# Patient Record
Sex: Male | Born: 1955 | Race: White | Hispanic: No | Marital: Married | Smoking: Former smoker
Health system: Southern US, Community
[De-identification: ages and names within clinical notes are randomized; demographics above are authoritative.]

## PROBLEM LIST (undated history)

## (undated) DIAGNOSIS — J449 Chronic obstructive pulmonary disease, unspecified: Secondary | ICD-10-CM

## (undated) DIAGNOSIS — G2581 Restless legs syndrome: Secondary | ICD-10-CM

## (undated) DIAGNOSIS — F32A Depression, unspecified: Secondary | ICD-10-CM

---

## 2015-05-21 DIAGNOSIS — I2699 Other pulmonary embolism without acute cor pulmonale: Secondary | ICD-10-CM

## 2015-05-21 HISTORY — DX: Other pulmonary embolism without acute cor pulmonale: I26.99

## 2015-11-06 DIAGNOSIS — R079 Chest pain, unspecified: Secondary | ICD-10-CM

## 2015-11-06 DIAGNOSIS — I2699 Other pulmonary embolism without acute cor pulmonale: Secondary | ICD-10-CM

## 2015-11-06 DIAGNOSIS — I82409 Acute embolism and thrombosis of unspecified deep veins of unspecified lower extremity: Secondary | ICD-10-CM

## 2015-11-29 DIAGNOSIS — I2699 Other pulmonary embolism without acute cor pulmonale: Secondary | ICD-10-CM

## 2015-11-29 DIAGNOSIS — I82409 Acute embolism and thrombosis of unspecified deep veins of unspecified lower extremity: Secondary | ICD-10-CM

## 2020-10-25 ENCOUNTER — Encounter (HOSPITAL_COMMUNITY): Payer: Self-pay | Admitting: Emergency Medicine

## 2020-10-25 ENCOUNTER — Emergency Department (HOSPITAL_COMMUNITY): Payer: Managed Care, Other (non HMO)

## 2020-10-25 ENCOUNTER — Observation Stay (HOSPITAL_COMMUNITY)
Admission: EM | Admit: 2020-10-25 | Discharge: 2020-10-26 | Disposition: A | Discharge status: Home / Self Care | Payer: Managed Care, Other (non HMO) | Attending: Internal Medicine | Admitting: Internal Medicine

## 2020-10-25 DIAGNOSIS — Z8673 Personal history of transient ischemic attack (TIA), and cerebral infarction without residual deficits: Secondary | ICD-10-CM | POA: Insufficient documentation

## 2020-10-25 DIAGNOSIS — Z87891 Personal history of nicotine dependence: Secondary | ICD-10-CM | POA: Insufficient documentation

## 2020-10-25 DIAGNOSIS — R531 Weakness: Secondary | ICD-10-CM | POA: Diagnosis present

## 2020-10-25 DIAGNOSIS — Z7982 Long term (current) use of aspirin: Secondary | ICD-10-CM | POA: Diagnosis not present

## 2020-10-25 DIAGNOSIS — Z8616 Personal history of COVID-19: Secondary | ICD-10-CM | POA: Insufficient documentation

## 2020-10-25 DIAGNOSIS — I2699 Other pulmonary embolism without acute cor pulmonale: Secondary | ICD-10-CM | POA: Diagnosis present

## 2020-10-25 DIAGNOSIS — J449 Chronic obstructive pulmonary disease, unspecified: Secondary | ICD-10-CM | POA: Insufficient documentation

## 2020-10-25 DIAGNOSIS — Z79899 Other long term (current) drug therapy: Secondary | ICD-10-CM | POA: Insufficient documentation

## 2020-10-25 DIAGNOSIS — F32A Depression, unspecified: Secondary | ICD-10-CM | POA: Diagnosis present

## 2020-10-25 DIAGNOSIS — Z20822 Contact with and (suspected) exposure to covid-19: Secondary | ICD-10-CM | POA: Insufficient documentation

## 2020-10-25 DIAGNOSIS — E876 Hypokalemia: Secondary | ICD-10-CM | POA: Diagnosis not present

## 2020-10-25 DIAGNOSIS — G459 Transient cerebral ischemic attack, unspecified: Principal | ICD-10-CM | POA: Insufficient documentation

## 2020-10-25 DIAGNOSIS — R7303 Prediabetes: Secondary | ICD-10-CM | POA: Insufficient documentation

## 2020-10-25 DIAGNOSIS — Z28310 Unvaccinated for covid-19: Secondary | ICD-10-CM | POA: Insufficient documentation

## 2020-10-25 DIAGNOSIS — G2581 Restless legs syndrome: Secondary | ICD-10-CM | POA: Diagnosis present

## 2020-10-25 DIAGNOSIS — R200 Anesthesia of skin: Secondary | ICD-10-CM

## 2020-10-25 HISTORY — DX: Depression, unspecified: F32.A

## 2020-10-25 HISTORY — DX: Chronic obstructive pulmonary disease, unspecified: J44.9

## 2020-10-25 HISTORY — DX: Restless legs syndrome: G25.81

## 2020-10-25 LAB — COMPREHENSIVE METABOLIC PANEL
ALT: 25 U/L (ref 0–44)
AST: 27 U/L (ref 15–41)
Albumin: 3.8 g/dL (ref 3.5–5.0)
Alkaline Phosphatase: 110 U/L (ref 38–126)
Anion gap: 10 (ref 5–15)
BUN: 15 mg/dL (ref 8–23)
CO2: 19 mmol/L — ABNORMAL LOW (ref 22–32)
Calcium: 9 mg/dL (ref 8.9–10.3)
Chloride: 107 mmol/L (ref 98–111)
Creatinine, Ser: 1.22 mg/dL (ref 0.61–1.24)
GFR, Estimated: >60 mL/min (ref >60)
Glucose, Bld: 120 mg/dL — ABNORMAL HIGH (ref 70–99)
Potassium: 4.3 mmol/L (ref 3.5–5.1)
Sodium: 136 mmol/L (ref 135–145)
Total Bilirubin: 1.3 mg/dL — ABNORMAL HIGH (ref 0.3–1.2)
Total Protein: 6.5 g/dL (ref 6.5–8.1)

## 2020-10-25 LAB — CBC WITH DIFFERENTIAL/PLATELET
Abs Immature Granulocytes: 0.05 10*3/uL (ref 0.00–0.07)
Basophils Absolute: 0.1 10*3/uL (ref 0.0–0.1)
Basophils Relative: 1 %
Eosinophils Absolute: 0.3 10*3/uL (ref 0.0–0.5)
Eosinophils Relative: 4 %
HCT: 44.3 % (ref 39.0–52.0)
Hemoglobin: 15.2 g/dL (ref 13.0–17.0)
Immature Granulocytes: 1 %
Lymphocytes Relative: 37 %
Lymphs Abs: 2.8 10*3/uL (ref 0.7–4.0)
MCH: 31 pg (ref 26.0–34.0)
MCHC: 34.3 g/dL (ref 30.0–36.0)
MCV: 90.4 fL (ref 80.0–100.0)
Monocytes Absolute: 0.8 10*3/uL (ref 0.1–1.0)
Monocytes Relative: 11 %
Neutro Abs: 3.5 10*3/uL (ref 1.7–7.7)
Neutrophils Relative %: 46 %
Platelets: 229 10*3/uL (ref 150–400)
RBC: 4.9 MIL/uL (ref 4.22–5.81)
RDW: 12.6 % (ref 11.5–15.5)
WBC: 7.5 10*3/uL (ref 4.0–10.5)
nRBC: 0 % (ref 0.0–0.2)

## 2020-10-25 LAB — I-STAT CHEM 8, ED
BUN: 20 mg/dL (ref 8–23)
Calcium, Ion: 1.03 mmol/L — ABNORMAL LOW (ref 1.15–1.40)
Chloride: 108 mmol/L (ref 98–111)
Creatinine, Ser: 1.1 mg/dL (ref 0.61–1.24)
Glucose, Bld: 120 mg/dL — ABNORMAL HIGH (ref 70–99)
HCT: 47 % (ref 39.0–52.0)
Hemoglobin: 16 g/dL (ref 13.0–17.0)
Potassium: 4.6 mmol/L (ref 3.5–5.1)
Sodium: 139 mmol/L (ref 135–145)
TCO2: 24 mmol/L (ref 22–32)

## 2020-10-25 LAB — I-STAT ARTERIAL BLOOD GAS, ED
Acid-base deficit: 2 mmol/L (ref 0.0–2.0)
Bicarbonate: 19.6 mmol/L — ABNORMAL LOW (ref 20.0–28.0)
Calcium, Ion: 1.17 mmol/L (ref 1.15–1.40)
HCT: 42 % (ref 39.0–52.0)
Hemoglobin: 14.3 g/dL (ref 13.0–17.0)
O2 Saturation: 98 %
Potassium: 3.9 mmol/L (ref 3.5–5.1)
Sodium: 137 mmol/L (ref 135–145)
TCO2: 20 mmol/L — ABNORMAL LOW (ref 22–32)
pCO2 arterial: 26.3 mmHg — ABNORMAL LOW (ref 32.0–48.0)
pH, Arterial: 7.481 — ABNORMAL HIGH (ref 7.350–7.450)
pO2, Arterial: 92 mmHg (ref 83.0–108.0)

## 2020-10-25 LAB — PROTIME-INR
INR: 1 (ref 0.8–1.2)
Prothrombin Time: 13.5 seconds (ref 11.4–15.2)

## 2020-10-25 LAB — LIPID PANEL
Cholesterol: 210 mg/dL — ABNORMAL HIGH (ref 0–200)
HDL: 43 mg/dL (ref >40)
LDL Cholesterol: 121 mg/dL — ABNORMAL HIGH (ref 0–99)
Total CHOL/HDL Ratio: 4.9 RATIO
Triglycerides: 231 mg/dL — ABNORMAL HIGH (ref <150)
VLDL: 46 mg/dL — ABNORMAL HIGH (ref 0–40)

## 2020-10-25 LAB — CBG MONITORING, ED: Glucose-Capillary: 104 mg/dL — ABNORMAL HIGH (ref 70–99)

## 2020-10-25 LAB — APTT: aPTT: 21 seconds — ABNORMAL LOW (ref 24–36)

## 2020-10-25 LAB — RESP PANEL BY RT-PCR (FLU A&B, COVID) ARPGX2
Influenza A by PCR: NEGATIVE
Influenza B by PCR: NEGATIVE
SARS Coronavirus 2 by RT PCR: NEGATIVE

## 2020-10-25 MED ORDER — ASPIRIN EC 325 MG PO TBEC
325.0000 mg | DELAYED_RELEASE_TABLET | Freq: Every day | ORAL | Status: DC
  Administered 2020-10-26: 325 mg via ORAL
  Filled 2020-10-25: qty 1

## 2020-10-25 MED ORDER — UMECLIDINIUM-VILANTEROL 62.5-25 MCG/INH IN AEPB
1.0000 | INHALATION_SPRAY | Freq: Every day | RESPIRATORY_TRACT | Status: DC
  Administered 2020-10-25 – 2020-10-26 (×2): 1 via RESPIRATORY_TRACT
  Filled 2020-10-25: qty 14

## 2020-10-25 MED ORDER — SODIUM CHLORIDE 0.9% FLUSH: 3.0000 mL | Freq: Once | INTRAVENOUS | Status: DC

## 2020-10-25 MED ORDER — ENOXAPARIN SODIUM 40 MG/0.4ML IJ SOSY
40.0000 mg | PREFILLED_SYRINGE | Freq: Every day | INTRAMUSCULAR | Status: DC
  Administered 2020-10-25 – 2020-10-26 (×2): 40 mg via SUBCUTANEOUS
  Filled 2020-10-25 (×2): qty 0.4

## 2020-10-25 MED ORDER — SENNOSIDES-DOCUSATE SODIUM 8.6-50 MG PO TABS: 1.0000 | ORAL_TABLET | Freq: Every evening | ORAL | Status: DC | PRN

## 2020-10-25 MED ORDER — ACETAMINOPHEN 325 MG PO TABS: 650.0000 mg | ORAL_TABLET | ORAL | Status: DC | PRN

## 2020-10-25 MED ORDER — FLUOXETINE HCL 20 MG PO CAPS
20.0000 mg | ORAL_CAPSULE | Freq: Every day | ORAL | Status: DC
  Administered 2020-10-25 – 2020-10-26 (×2): 20 mg via ORAL
  Filled 2020-10-25 (×2): qty 1

## 2020-10-25 MED ORDER — ACETAMINOPHEN 650 MG RE SUPP: 650.0000 mg | RECTAL | Status: DC | PRN

## 2020-10-25 MED ORDER — ASPIRIN EC 325 MG PO TBEC
325.0000 mg | DELAYED_RELEASE_TABLET | Freq: Once | ORAL | Status: AC
  Administered 2020-10-25: 325 mg via ORAL

## 2020-10-25 MED ORDER — STROKE: EARLY STAGES OF RECOVERY BOOK
Freq: Once | Status: AC
  Filled 2020-10-25: qty 1

## 2020-10-25 MED ORDER — ACETAMINOPHEN 160 MG/5ML PO SOLN: 650.0000 mg | ORAL | Status: DC | PRN

## 2020-10-25 MED ORDER — IOHEXOL 350 MG/ML SOLN
75.0000 mL | Freq: Once | INTRAVENOUS | Status: AC | PRN
  Administered 2020-10-25: 75 mL via INTRAVENOUS

## 2020-10-25 MED ORDER — SODIUM CHLORIDE 0.9 % IV SOLN: INTRAVENOUS | Status: DC

## 2020-10-25 NOTE — ED Notes (Signed)
Patient transported to MRI 

## 2020-10-25 NOTE — Code Documentation (Signed)
Stroke Response Nurse Documentation Code Stroke Documentation Hayden Golden  is a 65 y.o.  y.o. male  arriving to [facility] ED via [facility] on 10/25/2020 with PMH of NSTEMI, PE, DVT, Stroke. Code stroke was activated by EMS. Patient from home where he was LKW at 0600 when he woke up but began experiencing sudden onset L weakness in the shower causing him to almost fall but was able to catch himself per pt. Patient taking aspirin 325 mg daily PTA, Eliquis in chart but per pt taking only ASA. Stroke team at the bedside on patient arrival, CBG 104, labs drawn and patient cleared for CT by Dr. Charm Barges. Patient taken to CT with team. NIHSS 2, see documentation for details and code stroke times. Patient with decreased LOC and left decreased sensation on exam. The following imaging was completed:  CT, CTA head and neck. Patient is a candidate for tPA, however too good to treat at this time per Dr. Selina Cooley. q41m VS with q74m mNIHSS while inside tPA window, until 1030 followed by q2h. Bedside handoff with ED RN Chestine Spore.    Hayden Golden  Stroke Response RN [phone]

## 2020-10-25 NOTE — ED Provider Notes (Signed)
[facility] EMERGENCY DEPARTMENT Provider Note   CSN: [id number] Arrival date & time: 10/25/20  0750     History No chief complaint on file.   Hayden Golden is a 65 y.o. male.  He has a history of PE DVT and had COVID in January.  He is coming in as a stroke activation.  He woke up this morning without any deficits.  In the shower at around 6 AM had acute onset of left-sided weakness.  No trauma.  On arrival here patient very fatigued and has difficulty getting a good exam.  Complaining of some numbness on the left side of his body.  The history is provided by the patient and the EMS personnel.  Cerebrovascular Accident This is a new problem. The current episode started 1 to 2 hours ago. The problem occurs constantly. The problem has been gradually improving. Pertinent negatives include no chest pain, no abdominal pain, no headaches and no shortness of breath. Nothing aggravates the symptoms. Nothing relieves the symptoms. He has tried nothing for the symptoms. The treatment provided mild relief.       No past medical history on file.  There are no problems to display for this patient.   History reviewed. No pertinent surgical history.     No family history on file.     Home Medications Prior to Admission medications   Not on File    Allergies    Patient has no allergy information on record.  Review of Systems   Review of Systems  Constitutional: Positive for fatigue. Negative for fever.  HENT: Negative for sore throat.   Respiratory: Negative for shortness of breath.   Cardiovascular: Negative for chest pain.  Gastrointestinal: Negative for abdominal pain.  Genitourinary: Negative for dysuria.  Musculoskeletal: Negative for joint swelling.  Skin: Negative for rash.  Neurological: Positive for weakness and numbness. Negative for headaches.    Physical Exam Updated Vital Signs BP (!) 147/84   Pulse (!) 57   Resp 16   Ht  (1.778 m)    Wt 106.2 kg   SpO2 100%   BMI 33.59 kg/m   Physical Exam Vitals and nursing note reviewed.  Constitutional:      Appearance: Normal appearance. He is well-developed.  HENT:     Head: Normocephalic and atraumatic.  Eyes:     Conjunctiva/sclera: Conjunctivae normal.  Cardiovascular:     Rate and Rhythm: Normal rate and regular rhythm.     Heart sounds: No murmur heard.   Pulmonary:     Effort: Pulmonary effort is normal. No respiratory distress.     Breath sounds: Normal breath sounds.  Abdominal:     Palpations: Abdomen is soft.     Tenderness: There is no abdominal tenderness.  Musculoskeletal:        General: Normal range of motion.     Cervical back: Neck supple.     Right lower leg: No edema.     Left lower leg: No edema.  Skin:    General: Skin is warm and dry.  Neurological:     Mental Status: He is alert and oriented to person, place, and time.     Comments: Exam is rather difficult because the patient has pain and that seems to limit his participation in exam.  Grip strengths seem intact and no drift.  He has pain in his left groin when tries to raise his leg up.  Distal plantar and dorsiflexion symmetric.  From a  sensation is intact in his legs and his arms.  He does have some tingling in the left side of his face around his ear and high.  No facial asymmetry.  No dysarthria.     ED Results / Procedures / Treatments   Labs (all labs ordered are listed, but only abnormal results are displayed) Labs Reviewed  APTT - Abnormal; Notable for the following components:      Result Value   aPTT 21 (*)    All other components within normal limits  COMPREHENSIVE METABOLIC PANEL - Abnormal; Notable for the following components:   CO2 19 (*)    Glucose, Bld 120 (*)    Total Bilirubin 1.3 (*)    All other components within normal limits  LIPID PANEL - Abnormal; Notable for the following components:   Cholesterol 210 (*)    Triglycerides 231 (*)    VLDL 46 (*)    LDL  Cholesterol 121 (*)    All other components within normal limits  I-STAT CHEM 8, ED - Abnormal; Notable for the following components:   Glucose, Bld 120 (*)    Calcium, Ion 1.03 (*)    All other components within normal limits  CBG MONITORING, ED - Abnormal; Notable for the following components:   Glucose-Capillary 104 (*)    All other components within normal limits  I-STAT ARTERIAL BLOOD GAS, ED - Abnormal; Notable for the following components:   pH, Arterial 7.481 (*)    pCO2 arterial 26.3 (*)    Bicarbonate 19.6 (*)    TCO2 20 (*)    All other components within normal limits  RESP PANEL BY RT-PCR (FLU A&B, COVID) [id number]  PROTIME-INR  CBC WITH DIFFERENTIAL/PLATELET  HEMOGLOBIN A1C  RAPID URINE DRUG SCREEN, HOSP PERFORMED  HIV ANTIBODY (ROUTINE TESTING W REFLEX)    EKG EKG Interpretation  Date/Time:  Wednesday October 25 2020 08:18:16 EDT Ventricular Rate:  59 PR Interval:  179 QRS Duration: 100 QT Interval:  443 QTC Calculation: 439 R Axis:   62 Text Interpretation: Sinus rhythm Ventricular premature complex No old tracing to compare Confirmed by Meridee ScoreButler, Rishan Oyama [phone]([id number]) on 10/25/2020 8:26:00 AM   Radiology MR BRAIN WO CONTRAST  Result Date: 10/25/2020 CLINICAL DATA:  Weakness and nausea, code stroke follow-up EXAM: MRI HEAD WITHOUT CONTRAST TECHNIQUE: Multiplanar, multiecho pulse sequences of the brain and surrounding structures were obtained without intravenous contrast. COMPARISON:  None. FINDINGS: Brain: There is no acute infarction or intracranial hemorrhage. There is no intracranial mass, mass effect, or edema. There is no hydrocephalus or extra-axial fluid collection. Ventricles and sulci are normal in size and configuration. Patchy foci of T2 hyperintensity in the supratentorial white matter are nonspecific but may reflect minor chronic microvascular ischemic changes. Vascular: Major vessel flow voids at the skull base are preserved. Skull and upper cervical spine: Normal  marrow signal is preserved. Sinuses/Orbits: Lobular bilateral maxillary sinus alveolar recess mucosal thickening. Minor paranasal sinus mucosal thickening elsewhere. Orbits are unremarkable. Other: Sella is unremarkable.  Mastoid air cells are clear. IMPRESSION: No acute infarction, hemorrhage, or mass. Minor chronic microvascular ischemic changes. Electronically Signed   By: Guadlupe SpanishPraneil  Patel M.D.   On: 10/25/2020 11:43   CT HEAD CODE STROKE WO CONTRAST  Result Date: 10/25/2020 CLINICAL DATA:  Code stroke. Sudden onset dizziness and nausea. Generalized weakness. EXAM: CT HEAD WITHOUT CONTRAST TECHNIQUE: Contiguous axial images were obtained from the base of the skull through the vertex without intravenous contrast. COMPARISON:  CT head without contrast 11/07/2015.  FINDINGS: Brain: No acute infarct, hemorrhage, or mass lesion is present. No significant white matter lesions are present. The ventricles are of normal size. No significant extraaxial fluid collection is present. The brainstem and cerebellum are within normal limits. Vascular: No hyperdense vessel or unexpected calcification. Skull: Calvarium is intact. No focal lytic or blastic lesions are present. No significant extracranial soft tissue lesion is present. Sinuses/Orbits: The paranasal sinuses and mastoid air cells are clear. The globes and orbits are within normal limits. ASPECTS [facility] Stroke Program Early CT Score) - Ganglionic level infarction (caudate, lentiform nuclei, internal capsule, insula, M1-M3 cortex): 7/7 - Supraganglionic infarction (M4-M6 cortex): 3/3 Total score (0-10 with 10 being normal): 10/10 IMPRESSION: Negative CT of the head. Aspects 10/10 The above was relayed via text pager to Dr. Selina Cooley on 10/25/2020 at 08:04 . Electronically Signed   By: Marin Roberts M.D.   On: 10/25/2020 08:06   CT ANGIO HEAD CODE STROKE  Result Date: 10/25/2020 CLINICAL DATA:  Acute onset of weakness and nausea. Dizziness. Code stroke. EXAM: CT  ANGIOGRAPHY HEAD AND NECK TECHNIQUE: Multidetector CT imaging of the head and neck was performed using the standard protocol during bolus administration of intravenous contrast. Multiplanar CT image reconstructions and MIPs were obtained to evaluate the vascular anatomy. Carotid stenosis measurements (when applicable) are obtained utilizing NASCET criteria, using the distal internal carotid diameter as the denominator. CONTRAST:  15mL OMNIPAQUE IOHEXOL 350 MG/ML SOLN COMPARISON:  None. CT head without contrast 10/25/2020 and 11/07/2015 FINDINGS: CTA NECK FINDINGS Aortic arch: A 3 vessel arch configuration is present. No significant atherosclerotic calcifications are present. No aneurysm or stenosis is present. Great vessel origins are unremarkable. Right carotid system: The right common carotid artery is within normal limits. A noncalcified plaque is present along the posterior margin of the proximal right ICA without significant stenosis. Cervical right ICA is otherwise normal. Left carotid system: The left common carotid artery is within normal limits. Minimal irregularity is present bifurcation without a significant stenosis. Cervical left ICA is otherwise normal. Vertebral arteries: The vertebral arteries are codominant. Both vertebral arteries originate from the subclavian arteries without significant stenosis. No significant stenosis is present in either vertebral artery in the neck. Skeleton: Endplate degenerative changes are present at C5-6 with uncovertebral spurring and foraminal narrowing bilaterally. Vertebral body heights and alignment are otherwise normal. Multiple dental caries noted. No focal lytic or blastic lesions are present. Other neck: Soft tissues the neck are otherwise unremarkable. Upper chest: Centrilobular emphysematous changes are present. Some dependent atelectasis noted. Review of the MIP images confirms the above findings CTA HEAD FINDINGS Anterior circulation: Internal carotid  arteries are within normal limits from the skull base through the ICA termini. The A1 and M1 segments are within normal limits. The anterior communicating artery is patent. MCA bifurcations are unremarkable. The ACA and MCA branch vessels are within normal limits. Posterior circulation: The vertebral arteries are codominant. Right PICA origin visualized. AICA vessels are dominant. Vertebrobasilar junction is within normal limits. The basilar artery is normal. Both posterior cerebral arteries originate from basilar tip. The PCA branch vessels are within normal limits bilaterally. Venous sinuses: Dural sinuses are patent. The straight sinus deep cerebral veins are intact. Cortical veins are unremarkable. No vascular malformation is present. Anatomic variants: None Review of the MIP images confirms the above findings IMPRESSION: 1. No emergent large vessel occlusion. 2. Minimal atherosclerotic changes at the carotid bifurcations bilaterally without significant stenosis. 3. CTA neck otherwise within normal limits. 4. Normal variant CTA  Circle of [place] without significant proximal stenosis, aneurysm, or branch vessel occlusion. 5. Degenerative changes of the cervical spine are most evident at C5-6. 6. Emphysema (ICD10-J43.9). Electronically Signed   By: Marin Roberts M.D.   On: 10/25/2020 08:27   CT ANGIO NECK CODE STROKE  Result Date: 10/25/2020 CLINICAL DATA:  Acute onset of weakness and nausea. Dizziness. Code stroke. EXAM: CT ANGIOGRAPHY HEAD AND NECK TECHNIQUE: Multidetector CT imaging of the head and neck was performed using the standard protocol during bolus administration of intravenous contrast. Multiplanar CT image reconstructions and MIPs were obtained to evaluate the vascular anatomy. Carotid stenosis measurements (when applicable) are obtained utilizing NASCET criteria, using the distal internal carotid diameter as the denominator. CONTRAST:  9mL OMNIPAQUE IOHEXOL 350 MG/ML SOLN COMPARISON:   None. CT head without contrast 10/25/2020 and 11/07/2015 FINDINGS: CTA NECK FINDINGS Aortic arch: A 3 vessel arch configuration is present. No significant atherosclerotic calcifications are present. No aneurysm or stenosis is present. Great vessel origins are unremarkable. Right carotid system: The right common carotid artery is within normal limits. A noncalcified plaque is present along the posterior margin of the proximal right ICA without significant stenosis. Cervical right ICA is otherwise normal. Left carotid system: The left common carotid artery is within normal limits. Minimal irregularity is present bifurcation without a significant stenosis. Cervical left ICA is otherwise normal. Vertebral arteries: The vertebral arteries are codominant. Both vertebral arteries originate from the subclavian arteries without significant stenosis. No significant stenosis is present in either vertebral artery in the neck. Skeleton: Endplate degenerative changes are present at C5-6 with uncovertebral spurring and foraminal narrowing bilaterally. Vertebral body heights and alignment are otherwise normal. Multiple dental caries noted. No focal lytic or blastic lesions are present. Other neck: Soft tissues the neck are otherwise unremarkable. Upper chest: Centrilobular emphysematous changes are present. Some dependent atelectasis noted. Review of the MIP images confirms the above findings CTA HEAD FINDINGS Anterior circulation: Internal carotid arteries are within normal limits from the skull base through the ICA termini. The A1 and M1 segments are within normal limits. The anterior communicating artery is patent. MCA bifurcations are unremarkable. The ACA and MCA branch vessels are within normal limits. Posterior circulation: The vertebral arteries are codominant. Right PICA origin visualized. AICA vessels are dominant. Vertebrobasilar junction is within normal limits. The basilar artery is normal. Both posterior cerebral  arteries originate from basilar tip. The PCA branch vessels are within normal limits bilaterally. Venous sinuses: Dural sinuses are patent. The straight sinus deep cerebral veins are intact. Cortical veins are unremarkable. No vascular malformation is present. Anatomic variants: None Review of the MIP images confirms the above findings IMPRESSION: 1. No emergent large vessel occlusion. 2. Minimal atherosclerotic changes at the carotid bifurcations bilaterally without significant stenosis. 3. CTA neck otherwise within normal limits. 4. Normal variant CTA Circle of [place] without significant proximal stenosis, aneurysm, or branch vessel occlusion. 5. Degenerative changes of the cervical spine are most evident at C5-6. 6. Emphysema (ICD10-J43.9). Electronically Signed   By: Marin Roberts M.D.   On: 10/25/2020 08:27    Procedures .Critical Care Performed by: Terrilee Files, MD Authorized by: Terrilee Files, MD   Critical care provider statement:    Critical care time (minutes):  45   Critical care time was exclusive of:  Separately billable procedures and treating other patients   Critical care was necessary to treat or prevent imminent or life-threatening deterioration of the following conditions:  CNS failure or compromise  Critical care was time spent personally by me on the following activities:  Discussions with consultants, evaluation of patient's response to treatment, examination of patient, ordering and performing treatments and interventions, ordering and review of laboratory studies, ordering and review of radiographic studies, pulse oximetry, re-evaluation of patient's condition, obtaining history from patient or surrogate, review of old charts and development of treatment plan with patient or surrogate     Medications Ordered in ED Medications  enoxaparin (LOVENOX) injection 40 mg (40 mg Subcutaneous Given 10/25/20 1724)  0.9 %  sodium chloride infusion ( Intravenous New  Bag/Given 10/25/20 1409)  acetaminophen (TYLENOL) tablet 650 mg (has no administration in time range)    Or  acetaminophen (TYLENOL) 160 MG/5ML solution 650 mg (has no administration in time range)    Or  acetaminophen (TYLENOL) suppository 650 mg (has no administration in time range)  senna-docusate (Senokot-S) tablet 1 tablet (has no administration in time range)  umeclidinium-vilanterol (ANORO ELLIPTA) 62.5-25 MCG/INH 1 puff (1 puff Inhalation Given 10/25/20 1816)  FLUoxetine (PROZAC) capsule 20 mg (20 mg Oral Given 10/25/20 1725)  aspirin EC tablet 325 mg (has no administration in time range)  iohexol (OMNIPAQUE) 350 MG/ML injection 75 mL (75 mLs Intravenous Contrast Given 10/25/20 0813)  aspirin EC tablet 325 mg (325 mg Oral Given 10/25/20 1344)   stroke: mapping our early stages of recovery book ( Does not apply Given 10/25/20 1729)    ED Course  I have reviewed the triage vital signs and the nursing notes.  Pertinent labs & imaging results that were available during my care of the patient were reviewed by me and considered in my medical decision making (see chart for details).  Clinical Course as of 10/25/20 2104  Wed Oct 25, 2020  0840 Reviewed case with neurology Dr. Selina Cooley.  She is asking that the patient stay in the department until 1030 when he is out of the window for tPA.  This is in case he has any return of symptoms.  Otherwise his deficits are too minor to indicate tPA.  If no further worsening of symptoms they recommended medical work-up for further TIA stroke eval. [MB]  1100 Discussed with Dr. Ophelia Charter [facility] hospitalist who will evaluate the patient for admission. [MB]    Clinical Course User Index [MB] Terrilee Files, MD   MDM Rules/Calculators/A&P                         This patient complains of left-sided weakness and numbness; this involves an extensive number of treatment Options and is a complaint that carries with it a high risk of complications and Morbidity. The  differential includes stroke, TIA, bleed, hypertensive urgency, metabolic derangement, hypoglycemia  I ordered, reviewed and interpreted labs, which included CBC with normal white count normal hemoglobin, chemistries fairly unremarkable other than some mildly low bicarb, COVID and flu testing negative, ABG without signs of retention I ordered medication oral aspirin I ordered imaging studies which included CT head, CTA head and neck and I independently    visualized and interpreted imaging which showed no acute findings Additional history obtained from patient's wife, EMS Previous records obtained and reviewed in epic, no recent visits in our system.  [facility]. I consulted Dr. Selina Cooley neuro hospitalist and Dr. Ophelia Charter [facility] hospitalist and discussed lab and imaging findings  Critical Interventions: Emergent work-up for acute neurologic event in tPA window.  Currently no indications for tPA.  After  the interventions stated above, I reevaluated the patient and found patient symptoms to be improved.  He is agreeable to admission to the hospital for further work-up.   Final Clinical Impression(s) / ED Diagnoses Final diagnoses:  Acute left-sided weakness  Left sided numbness    Rx / DC Orders ED Discharge Orders    None       Terrilee Files, MD 10/25/20 2107

## 2020-10-25 NOTE — ED Triage Notes (Signed)
Patient BIB [facility] for sudden onset of left sided weakness, last known well 0600 this morning. Patient lethargic and oriented.

## 2020-10-25 NOTE — Consult Note (Signed)
Neurology Consultation and Code Stroke Note  Reason for Consult: Code Stroke: left-sided weakness and numbness Referring Physician: Dr. Charm Barges  CC: Decreased sensation of the left face, left leg, and weakness of the left hemibody  History is obtained from: Patient, EMS, Chart Review  HPI: Hayden Golden is a 65 y.o. right handed male with a medical history significant for emphysema/COPD, aortic atherosclerosis, NSTEMI in 2017, right-sided PE in 2017, DVT in 2018, and a reported stroke 4-5 years ago on ASA 325 mg daily who presented to the ED today for evaluation of left-sided weakness and numbness/tingling of the left face and left lower extremity. Patient states that he woke up at 06:00 in his normal state of health and went to get into the shower. While in the shower he suddenly felt extremely hot, nauseated, and felt off balance when he noticed that his left arm and leg were weak. He notes that he had a similar feeling of imbalance with his previous stroke 4 - 5 years ago. During his initial evaluation, he began to complain of left arm pain shooting from his left shoulder down the length of his left arm with left groin pain with left lower extremity movement as well as a "heaviness" associated with his respirations. He does report increased stress at work recently.   LKW: 06:00 tpa given?: no, symptoms too mild to treat, initial imaging without significant stenosis or acute intracranial abnormality IR Thrombectomy? No, presentation not consistent with LVO Modified Rankin Scale: 0-Completely asymptomatic and back to baseline post- stroke  ROS: A complete ROS was performed and is negative except as noted in the HPI.   Past Medical History Reports stroke 4-5 years ago, DVT, emphysema/COPD  History reviewed. No pertinent family history.  Social History:   has no history on file for tobacco use, alcohol use, and drug use.  Patient reports tobacco smoking over 20 years  ago  Medications  Current Facility-Administered Medications:  .  sodium chloride flush (NS) 0.9 % injection 3 mL, 3 mL, Intravenous, Once, Terrilee Files, MD No current outpatient medications on file.  Exam: Current vital signs: BP (!) 147/84   Pulse (!) 57   Resp 16   Ht 5\' 10"  (1.778 m)   Wt 106.2 kg   SpO2 100%   BMI 33.59 kg/m  Vital signs in last 24 hours: Pulse Rate:  [52-57] 57 (06/08 0819) Resp:  [16] 16 (06/08 0819) BP: (147-154)/(84-96) 147/84 (06/08 0801) SpO2:  [98 %-100 %] 100 % (06/08 0819) Weight:  [106.2 kg] 106.2 kg (06/08 0700)  GENERAL: Awake, drowsy, appears distressed Psych: Affect appropriate for situation, calm and cooperative with examination Head: Normocephalic and atraumatic, without obvious abnormality EENT: Normal conjunctivae, no OP obstruction, dry mucous membranes LUNGS: Normal respiratory effort, SpO2 100% on room air CV: Bradycardia on cardiac monitor, extremities cool without edema ABDOMEN: Soft, non-tender, non-distended Ext: cool, without obvious deformity  NEURO:  Mental Status: Drowsy but awake, alert, and oriented to person, place, time, and situation. He is able to provide a clear and coherent history of present illness. Speech is intact without dysarthria or aphasia. Naming, comprehension, and fluency are intact. Repetition with omission of 1-2 words consistently.  No neglect noted Cranial Nerves:  II: PERRL 5 mm / brisk. Visual fields full.  III, IV, VI: EOMI without ptosis V: Sensation to light touch intact on the right face, decreased on the left face with reports of numbness and tingling VII: Face is symmetric resting and smiling.  VIII: Hearing is intact to voice IX, X: Palate elevation is symmetric. Phonation normal.  XI: Normal sternocleidomastoid and trapezius muscle strength XII: Tongue protrudes midline without fasciculations.   Motor:  Bilateral upper extremities with antigravity movement without pronator drift on  examination. Left upper extremity assessment pain limited with movement. Initially, left grip strength significantly weaker than right grip strength, on reassessment bilateral grip strength 4/5.  Right lower extremity 5/5 strength without vertical drift, left lower extremity strength assessment limited due to left groin pain with movement. LLE with 4/5 strength.  Bilateral ankle dorsi/plantar flexion 3/5 strength.  Tone and bulk are normal.  Sensation: Intact to light touch on bilateral upper extremities and right lower extremity. Decreased sensation reported to light touch on the left lower extremity and left face.  Coordination: FTN intact bilaterally. HKS intact on the right lower extremity, assessment of left lower extremity limited due to left groin pain with movement of LLE. No pronator drift. DTRs: 2+ and symmetric biceps, 1+ and symmetric patellae Gait: deferred  NIHSS: 1a Level of Conscious.: 1 1b LOC Questions: 0 1c LOC Commands: 0 2 Best Gaze: 0 3 Visual: 0 4 Facial Palsy: 0 5a Motor Arm - left: 0 5b Motor Arm - Right: 0 6a Motor Leg - Left: 0 6b Motor Leg - Right: 0 7 Limb Ataxia: 0 8 Sensory: 1 9 Best Language: 0 10 Dysarthria: 0 11 Extinct. and Inatten.: 0 TOTAL: 2  Labs I have reviewed labs in epic and the results pertinent to this consultation are: CBC    Component Value Date/Time   WBC 7.5 10/25/2020 0823   RBC 4.90 10/25/2020 0823   HGB 15.2 10/25/2020 0823   HCT 44.3 10/25/2020 0823   PLT 229 10/25/2020 0823   MCV 90.4 10/25/2020 0823   MCH 31.0 10/25/2020 0823   MCHC 34.3 10/25/2020 0823   RDW 12.6 10/25/2020 0823   LYMPHSABS 2.8 10/25/2020 0823   MONOABS 0.8 10/25/2020 0823   EOSABS 0.3 10/25/2020 0823   BASOSABS 0.1 10/25/2020 0823   CMP     Component Value Date/Time   NA 137 10/25/2020 0822   K 3.9 10/25/2020 0822   CL 108 10/25/2020 0802   CO2 19 (L) 10/25/2020 0755   GLUCOSE 120 (H) 10/25/2020 0802   BUN 20 10/25/2020 0802   CREATININE  1.10 10/25/2020 0802   CALCIUM 9.0 10/25/2020 0755   PROT 6.5 10/25/2020 0755   ALBUMIN 3.8 10/25/2020 0755   AST 27 10/25/2020 0755   ALT 25 10/25/2020 0755   ALKPHOS 110 10/25/2020 0755   BILITOT 1.3 (H) 10/25/2020 0755   GFRNONAA >60 10/25/2020 0755   Lipid Panel  No results found for: CHOL, TRIG, HDL, CHOLHDL, VLDL, LDLCALC, LDLDIRECT No results found for: HGBA1C  Imaging I have reviewed the images obtained: CT-scan of the brain Negative CT of the head. Aspects 10/10  CT angio head and neck: 1. No emergent large vessel occlusion. 2. Minimal atherosclerotic changes at the carotid bifurcations bilaterally without significant stenosis. 3. CTA neck otherwise within normal limits. 4. Normal variant CTA [place] without significant proximal stenosis, aneurysm, or branch vessel occlusion. 5. Degenerative changes of the cervical spine are most evident at C5-6. 6. Emphysema (ICD10-J43.9).  Assessment: 65 year old male with a PMHx of DVT, PE, reported stroke 4-5 years ago on ASA 325 mg daily who presented for evaluation of acute left-sided weakness and sensory deficits with onset at 06:00.  - Examination reveals patient with left facial and LLE numbness  and tingling, initial left grip strength weakness, and left upper and lower extremity weakness within the limitation of a pain-limited assessment. Initial NIHSS of 2; 1 point for drowsy, 1 point for left-sided sensory deficits. - CT imaging obtained without acute intracranial abnormality. CT angio imaging obtained due to ongoing concern for acute ischemic stroke without evidence of LVO or significant cerebral vessel stenosis.  - tPA not given due to mild degree of symptoms- NIHSS of 2 and fluctuation of symptoms- improved grip strength and level of consciousness on reassessment. Will continue to monitor patient for neurologic worsening while in the tPA window.   Impression: Left-sided sensory deficits Fluctuating left-sided  weakness Concern for cerebral hypoperfusion  Recommendations: - HgbA1c, fasting lipid panel - Admit to hospitalist service for stroke w/u; stroke team will consult - Permissive HTN x48 hrs from sx onset or until stroke ruled out by MRI goal BP <220/110. PRN labetalol or hydralazine if BP above these parameters. Avoid oral antihypertensives. - MRI of the brain without contrast - Check A1c and LDL + add statin per guidelines - Frequent neuro checks - Echocardiogram - Prophylactic therapy- Antiplatelet med: Aspirin- continue home ASA 325 mg PO daily, include dose now - Risk factor modification - Telemetry monitoring - PT consult, OT consult, Speech consult - Stroke education - Amb referral to neurology upon discharge  Stroke team will continue to follow.   Lanae Boast, AGAC-NP [facility] Pager: [phone]  Neurology Attending Attestation   I examined the patient and discussed plan with Ms. Toberman NP. Above note has been edited by me to reflect my findings and recommendations. I was present throughout the stroke code and made all significant decisions and personally reviewed CNS imaging and also discussed CTA results with radiologist by phone.     Bing Neighbors, MD [facility] [phone]   If 7pm- 7am, please page neurology on call as listed in AMION.

## 2020-10-25 NOTE — H&P (Signed)
History and Physical    COE Hayden Golden:[id number] DOB: 04/20/1956 DOA: 10/25/2020  PCP: Douglass Rivers  Consultants:  None Patient coming from:  Home - lives with wife of 34 years; NOK: Hayden, Golden, [phone]  Chief Complaint: Neurologic symptoms  HPI: Hayden Golden is a 65 y.o. [place] male with medical history significant of PE in 2017, not on AC; COPD; depression; and RLS presenting with L-sided numbness/weakness.  He reports prior h/o CVA several years ago that presented very similarly.  This AM in the shower, he noticed L facial numbness followed by LUE/LLE weakness and numbness.  Symptoms lasted for minutes to an hour and resolved although he currently feels very fatigued.      ED Course:  LKW 0600.  In shower, nausea and L-sided numbness/weakness - face, arm, leg.  Drowsy upon arrival.  CT negative.  Neurology consulted, MRI pending.  Has painful movement of arm/leg.  Not a candidate for tPA.  Review of Systems: As per HPI; otherwise review of systems reviewed and negative.   Ambulatory Status:  Ambulates without assistance  COVID Vaccine Status:  None  Past Medical History:  Diagnosis Date  . COPD (chronic obstructive pulmonary disease) (HCC)   . Depression   . PE (pulmonary thromboembolism) (HCC) 2017  . RLS (restless legs syndrome)     History reviewed. No pertinent surgical history.  Social History   Socioeconomic History  . Marital status: Married    Spouse name: Not on file  . Number of children: Not on file  . Years of education: Not on file  . Highest education level: Not on file  Occupational History  . Occupation: drives heavy equipment  Tobacco Use  . Smoking status: Former Games developer  . Smokeless tobacco: Never Used  Substance and Sexual Activity  . Alcohol use: Yes    Alcohol/week: 5.0 standard drinks    Types: 5 Cans of beer per week  . Drug use: Never  . Sexual activity: Not on file  Other Topics Concern  . Not on file  Social  History Narrative  . Not on file   Social Determinants of Health   Financial Resource Strain: Not on file  Food Insecurity: Not on file  Transportation Needs: Not on file  Physical Activity: Not on file  Stress: Not on file  Social Connections: Not on file  Intimate Partner Violence: Not on file    Not on File  History reviewed. No pertinent family history.  Prior to Admission medications   Not on File    Physical Exam: Vitals:   10/25/20 1015 10/25/20 1030 10/25/20 1415 10/25/20 1500  BP: 138/77 (!) 157/111 121/82 123/63  Pulse: (!) 53 (!) 55    Resp: 16 14    Temp:      TempSrc:      SpO2: 99% 98%    Weight:      Height:         . General:  Appears calm and comfortable and is in NAD . Eyes:  PERRL, EOMI, normal lids, iris . ENT:  grossly normal hearing, lips & tongue, mmm . Neck:  no LAD, masses or thyromegaly; no carotid bruits . Cardiovascular:  RRR, no m/r/g. No LE edema.  Marland Kitchen Respiratory:   CTA bilaterally with no wheezes/rales/rhonchi.  Normal respiratory effort. . Abdomen:  soft, NT, ND . Skin:  no rash or induration seen on limited exam . Musculoskeletal:  grossly normal tone BUE/BLE, good ROM, no bony abnormality.  Of  note, he does report knee pain with hip flexion which is unusual for CVA presentation; he reports that this may be related to a work injury. Marland Kitchen Psychiatric:  grossly normal mood and affect, speech fluent and appropriate, AOx3 . Neurologic:  CN 2-12 grossly intact, moves all extremities in coordinated fashion, sensation intact    Radiological Exams on Admission: Independently reviewed - see discussion in A/P where applicable  MR BRAIN WO CONTRAST  Result Date: 10/25/2020 CLINICAL DATA:  Weakness and nausea, code stroke follow-up EXAM: MRI HEAD WITHOUT CONTRAST TECHNIQUE: Multiplanar, multiecho pulse sequences of the brain and surrounding structures were obtained without intravenous contrast. COMPARISON:  None. FINDINGS: Brain: There is no  acute infarction or intracranial hemorrhage. There is no intracranial mass, mass effect, or edema. There is no hydrocephalus or extra-axial fluid collection. Ventricles and sulci are normal in size and configuration. Patchy foci of T2 hyperintensity in the supratentorial white matter are nonspecific but may reflect minor chronic microvascular ischemic changes. Vascular: Major vessel flow voids at the skull base are preserved. Skull and upper cervical spine: Normal marrow signal is preserved. Sinuses/Orbits: Lobular bilateral maxillary sinus alveolar recess mucosal thickening. Minor paranasal sinus mucosal thickening elsewhere. Orbits are unremarkable. Other: Sella is unremarkable.  Mastoid air cells are clear. IMPRESSION: No acute infarction, hemorrhage, or mass. Minor chronic microvascular ischemic changes. Electronically Signed   By: Hayden Golden M.D.   On: 10/25/2020 11:43   CT HEAD CODE STROKE WO CONTRAST  Result Date: 10/25/2020 CLINICAL DATA:  Code stroke. Sudden onset dizziness and nausea. Generalized weakness. EXAM: CT HEAD WITHOUT CONTRAST TECHNIQUE: Contiguous axial images were obtained from the base of the skull through the vertex without intravenous contrast. COMPARISON:  CT head without contrast 11/07/2015. FINDINGS: Brain: No acute infarct, hemorrhage, or mass lesion is present. No significant white matter lesions are present. The ventricles are of normal size. No significant extraaxial fluid collection is present. The brainstem and cerebellum are within normal limits. Vascular: No hyperdense vessel or unexpected calcification. Skull: Calvarium is intact. No focal lytic or blastic lesions are present. No significant extracranial soft tissue lesion is present. Sinuses/Orbits: The paranasal sinuses and mastoid air cells are clear. The globes and orbits are within normal limits. ASPECTS [facility] Stroke Program Early CT Score) - Ganglionic level infarction (caudate, lentiform nuclei, internal capsule,  insula, M1-M3 cortex): 7/7 - Supraganglionic infarction (M4-M6 cortex): 3/3 Total score (0-10 with 10 being normal): 10/10 IMPRESSION: Negative CT of the head. Aspects 10/10 The above was relayed via text pager to Dr. Selina Cooley on 10/25/2020 at 08:04 . Electronically Signed   By: Marin Roberts M.D.   On: 10/25/2020 08:06   CT ANGIO HEAD CODE STROKE  Result Date: 10/25/2020 CLINICAL DATA:  Acute onset of weakness and nausea. Dizziness. Code stroke. EXAM: CT ANGIOGRAPHY HEAD AND NECK TECHNIQUE: Multidetector CT imaging of the head and neck was performed using the standard protocol during bolus administration of intravenous contrast. Multiplanar CT image reconstructions and MIPs were obtained to evaluate the vascular anatomy. Carotid stenosis measurements (when applicable) are obtained utilizing NASCET criteria, using the distal internal carotid diameter as the denominator. CONTRAST:  73mL OMNIPAQUE IOHEXOL 350 MG/ML SOLN COMPARISON:  None. CT head without contrast 10/25/2020 and 11/07/2015 FINDINGS: CTA NECK FINDINGS Aortic arch: A 3 vessel arch configuration is present. No significant atherosclerotic calcifications are present. No aneurysm or stenosis is present. Great vessel origins are unremarkable. Right carotid system: The right common carotid artery is within normal limits. A noncalcified plaque is  present along the posterior margin of the proximal right ICA without significant stenosis. Cervical right ICA is otherwise normal. Left carotid system: The left common carotid artery is within normal limits. Minimal irregularity is present bifurcation without a significant stenosis. Cervical left ICA is otherwise normal. Vertebral arteries: The vertebral arteries are codominant. Both vertebral arteries originate from the subclavian arteries without significant stenosis. No significant stenosis is present in either vertebral artery in the neck. Skeleton: Endplate degenerative changes are present at C5-6 with  uncovertebral spurring and foraminal narrowing bilaterally. Vertebral body heights and alignment are otherwise normal. Multiple dental caries noted. No focal lytic or blastic lesions are present. Other neck: Soft tissues the neck are otherwise unremarkable. Upper chest: Centrilobular emphysematous changes are present. Some dependent atelectasis noted. Review of the MIP images confirms the above findings CTA HEAD FINDINGS Anterior circulation: Internal carotid arteries are within normal limits from the skull base through the ICA termini. The A1 and M1 segments are within normal limits. The anterior communicating artery is patent. MCA bifurcations are unremarkable. The ACA and MCA branch vessels are within normal limits. Posterior circulation: The vertebral arteries are codominant. Right PICA origin visualized. AICA vessels are dominant. Vertebrobasilar junction is within normal limits. The basilar artery is normal. Both posterior cerebral arteries originate from basilar tip. The PCA branch vessels are within normal limits bilaterally. Venous sinuses: Dural sinuses are patent. The straight sinus deep cerebral veins are intact. Cortical veins are unremarkable. No vascular malformation is present. Anatomic variants: None Review of the MIP images confirms the above findings IMPRESSION: 1. No emergent large vessel occlusion. 2. Minimal atherosclerotic changes at the carotid bifurcations bilaterally without significant stenosis. 3. CTA neck otherwise within normal limits. 4. Normal variant CTA Circle of [place] without significant proximal stenosis, aneurysm, or branch vessel occlusion. 5. Degenerative changes of the cervical spine are most evident at C5-6. 6. Emphysema (ICD10-J43.9). Electronically Signed   By: Marin Roberts M.D.   On: 10/25/2020 08:27   CT ANGIO NECK CODE STROKE  Result Date: 10/25/2020 CLINICAL DATA:  Acute onset of weakness and nausea. Dizziness. Code stroke. EXAM: CT ANGIOGRAPHY HEAD AND NECK  TECHNIQUE: Multidetector CT imaging of the head and neck was performed using the standard protocol during bolus administration of intravenous contrast. Multiplanar CT image reconstructions and MIPs were obtained to evaluate the vascular anatomy. Carotid stenosis measurements (when applicable) are obtained utilizing NASCET criteria, using the distal internal carotid diameter as the denominator. CONTRAST:  75mL OMNIPAQUE IOHEXOL 350 MG/ML SOLN COMPARISON:  None. CT head without contrast 10/25/2020 and 11/07/2015 FINDINGS: CTA NECK FINDINGS Aortic arch: A 3 vessel arch configuration is present. No significant atherosclerotic calcifications are present. No aneurysm or stenosis is present. Great vessel origins are unremarkable. Right carotid system: The right common carotid artery is within normal limits. A noncalcified plaque is present along the posterior margin of the proximal right ICA without significant stenosis. Cervical right ICA is otherwise normal. Left carotid system: The left common carotid artery is within normal limits. Minimal irregularity is present bifurcation without a significant stenosis. Cervical left ICA is otherwise normal. Vertebral arteries: The vertebral arteries are codominant. Both vertebral arteries originate from the subclavian arteries without significant stenosis. No significant stenosis is present in either vertebral artery in the neck. Skeleton: Endplate degenerative changes are present at C5-6 with uncovertebral spurring and foraminal narrowing bilaterally. Vertebral body heights and alignment are otherwise normal. Multiple dental caries noted. No focal lytic or blastic lesions are present. Other neck: Soft  tissues the neck are otherwise unremarkable. Upper chest: Centrilobular emphysematous changes are present. Some dependent atelectasis noted. Review of the MIP images confirms the above findings CTA HEAD FINDINGS Anterior circulation: Internal carotid arteries are within normal limits  from the skull base through the ICA termini. The A1 and M1 segments are within normal limits. The anterior communicating artery is patent. MCA bifurcations are unremarkable. The ACA and MCA branch vessels are within normal limits. Posterior circulation: The vertebral arteries are codominant. Right PICA origin visualized. AICA vessels are dominant. Vertebrobasilar junction is within normal limits. The basilar artery is normal. Both posterior cerebral arteries originate from basilar tip. The PCA branch vessels are within normal limits bilaterally. Venous sinuses: Dural sinuses are patent. The straight sinus deep cerebral veins are intact. Cortical veins are unremarkable. No vascular malformation is present. Anatomic variants: None Review of the MIP images confirms the above findings IMPRESSION: 1. No emergent large vessel occlusion. 2. Minimal atherosclerotic changes at the carotid bifurcations bilaterally without significant stenosis. 3. CTA neck otherwise within normal limits. 4. Normal variant CTA Circle of [place] without significant proximal stenosis, aneurysm, or branch vessel occlusion. 5. Degenerative changes of the cervical spine are most evident at C5-6. 6. Emphysema (ICD10-J43.9). Electronically Signed   By: Marin Roberts M.D.   On: 10/25/2020 08:27    EKG: Independently reviewed.  NSR with rate 59; no evidence of acute ischemia   Labs on Admission: I have personally reviewed the available labs and imaging studies at the time of the admission.  Pertinent labs:   ABG: 7.481/26.3/92/19.6 Normal CBC Glucose 120   Assessment/Plan Principal Problem:   TIA (transient ischemic attack) Active Problems:   RLS (restless legs syndrome)   PE (pulmonary thromboembolism) (HCC)   Depression   COPD (chronic obstructive pulmonary disease) (HCC)   TIA -Patient presenting with L facial and body numbness and L body weakness; symptoms are similar to prior CVA -Concerning for TIA/CVA -Aspirin has  been given to reduce stroke mortality and decrease morbidity; he reports compliance with daily 325 mg ASA -Will place in observation status for CVA/TIA evaluation -Telemetry monitoring -MRI negative for CVA, but presentation is still concerning for CVA -CTA negative for significant carotid stenosis. -Echo -If the patient does not have known afib and this is not detected on telemetry during hospitalization, consider outpatient Holter monitoring and/or loop recorder placement. -Risk stratification with FLP, A1c; will also check UDS -Neurology consult -PT/OT/ST/Nutrition Consults  COPD -Continue Anoro   Depression -Continue Prozac     Note: This patient has been tested and is negative for the novel coronavirus COVID-19. He has NOT been vaccinated against COVID-19.    DVT prophylaxis:  Lovenox  Code Status: Full - confirmed with patient/family Family Communication: Wife present throughout evaluation Disposition Plan:  The patient is from: home  Anticipated d/c is to: home without [facility] services  Anticipated d/c date will depend on clinical response to treatment, but possibly as early as tomorrow if she has excellent response to treatment  Patient is currently: acutely ill Consults called: Neurology; PT/OT/ST/Nutrition Admission status: It is my clinical opinion that referral for OBSERVATION is reasonable and necessary in this patient based on the above information provided. The aforementioned taken together are felt to place the patient at high risk for further clinical deterioration. However it is anticipated that the patient may be medically stable for discharge from the hospital within 24 to 48 hours.     Jonah Blue MD [facility]   How to  contact the [facility] Attending or Consulting provider 7A - 7P or covering provider during after hours 7P -7A, for this patient?  1. Check the care team in [facility] and look for a) attending/consulting [facility] provider listed and b) the [facility] team  listed 2. Log into [email or web address] and use [place]'s universal password to access. If you do not have the password, please contact the hospital operator. 3. Locate the [facility] provider you are looking for under [facility] and page to a number that you can be directly reached. 4. If you still have difficulty reaching the provider, please page the [facility] (Director on Call) for the Hospitalists listed on amion for assistance.   10/25/2020, 5:00 PM

## 2020-10-26 ENCOUNTER — Observation Stay (HOSPITAL_BASED_OUTPATIENT_CLINIC_OR_DEPARTMENT_OTHER): Payer: Managed Care, Other (non HMO)

## 2020-10-26 DIAGNOSIS — G459 Transient cerebral ischemic attack, unspecified: Secondary | ICD-10-CM

## 2020-10-26 DIAGNOSIS — J449 Chronic obstructive pulmonary disease, unspecified: Secondary | ICD-10-CM

## 2020-10-26 DIAGNOSIS — R7303 Prediabetes: Secondary | ICD-10-CM

## 2020-10-26 DIAGNOSIS — E876 Hypokalemia: Secondary | ICD-10-CM | POA: Diagnosis not present

## 2020-10-26 DIAGNOSIS — F32A Depression, unspecified: Secondary | ICD-10-CM | POA: Diagnosis not present

## 2020-10-26 LAB — CBC WITH DIFFERENTIAL/PLATELET
Abs Immature Granulocytes: 0.05 10*3/uL (ref 0.00–0.07)
Basophils Absolute: 0.1 10*3/uL (ref 0.0–0.1)
Basophils Relative: 1 %
Eosinophils Absolute: 0.2 10*3/uL (ref 0.0–0.5)
Eosinophils Relative: 3 %
HCT: 46.8 % (ref 39.0–52.0)
Hemoglobin: 15.8 g/dL (ref 13.0–17.0)
Immature Granulocytes: 1 %
Lymphocytes Relative: 42 %
Lymphs Abs: 3.5 10*3/uL (ref 0.7–4.0)
MCH: 30.4 pg (ref 26.0–34.0)
MCHC: 33.8 g/dL (ref 30.0–36.0)
MCV: 90.2 fL (ref 80.0–100.0)
Monocytes Absolute: 0.7 10*3/uL (ref 0.1–1.0)
Monocytes Relative: 8 %
Neutro Abs: 3.9 10*3/uL (ref 1.7–7.7)
Neutrophils Relative %: 45 %
Platelets: 272 10*3/uL (ref 150–400)
RBC: 5.19 MIL/uL (ref 4.22–5.81)
RDW: 12.5 % (ref 11.5–15.5)
WBC: 8.4 10*3/uL (ref 4.0–10.5)
nRBC: 0 % (ref 0.0–0.2)

## 2020-10-26 LAB — BASIC METABOLIC PANEL
Anion gap: 10 (ref 5–15)
BUN: 13 mg/dL (ref 8–23)
CO2: 25 mmol/L (ref 22–32)
Calcium: 8.9 mg/dL (ref 8.9–10.3)
Chloride: 104 mmol/L (ref 98–111)
Creatinine, Ser: 1.21 mg/dL (ref 0.61–1.24)
GFR, Estimated: >60 mL/min (ref >60)
Glucose, Bld: 102 mg/dL — ABNORMAL HIGH (ref 70–99)
Potassium: 3.2 mmol/L — ABNORMAL LOW (ref 3.5–5.1)
Sodium: 139 mmol/L (ref 135–145)

## 2020-10-26 LAB — ECHOCARDIOGRAM COMPLETE
AR max vel: 2.79 cm2
AV Area VTI: 2.6 cm2
AV Area mean vel: 2.65 cm2
AV Mean grad: 4 mmHg
AV Peak grad: 7.3 mmHg
Ao pk vel: 1.35 m/s
Area-P 1/2: 4.89 cm2
Height: 70 in
S' Lateral: 3.1 cm
Weight: 3746.06 oz

## 2020-10-26 LAB — HEMOGLOBIN A1C
Hgb A1c MFr Bld: 6 % — ABNORMAL HIGH (ref 4.8–5.6)
Mean Plasma Glucose: 126 mg/dL

## 2020-10-26 LAB — MAGNESIUM: Magnesium: 1.9 mg/dL (ref 1.7–2.4)

## 2020-10-26 LAB — HIV ANTIBODY (ROUTINE TESTING W REFLEX): HIV Screen 4th Generation wRfx: NONREACTIVE

## 2020-10-26 MED ORDER — CLOPIDOGREL BISULFATE 300 MG PO TABS
300.0000 mg | ORAL_TABLET | Freq: Once | ORAL | Status: AC
  Administered 2020-10-26: 300 mg via ORAL
  Filled 2020-10-26: qty 1

## 2020-10-26 MED ORDER — PERFLUTREN LIPID MICROSPHERE
1.0000 mL | INTRAVENOUS | Status: AC | PRN
  Administered 2020-10-26: 5 mL via INTRAVENOUS
  Filled 2020-10-26: qty 10

## 2020-10-26 MED ORDER — ASPIRIN 81 MG PO CHEW: 81.0000 mg | CHEWABLE_TABLET | Freq: Every day | ORAL | 0 refills | Status: AC

## 2020-10-26 MED ORDER — CLOPIDOGREL BISULFATE 75 MG PO TABS: 75.0000 mg | ORAL_TABLET | Freq: Every day | ORAL | Status: DC

## 2020-10-26 MED ORDER — POTASSIUM CHLORIDE CRYS ER 20 MEQ PO TBCR
40.0000 meq | EXTENDED_RELEASE_TABLET | ORAL | Status: DC
  Administered 2020-10-26: 40 meq via ORAL
  Filled 2020-10-26 (×2): qty 2

## 2020-10-26 MED ORDER — ATORVASTATIN CALCIUM 80 MG PO TABS: 80.0000 mg | ORAL_TABLET | Freq: Every day | ORAL | 1 refills | Status: AC

## 2020-10-26 MED ORDER — ASPIRIN 81 MG PO CHEW: 81.0000 mg | CHEWABLE_TABLET | Freq: Every day | ORAL | Status: DC

## 2020-10-26 MED ORDER — ADULT MULTIVITAMIN W/MINERALS CH
1.0000 | ORAL_TABLET | Freq: Every day | ORAL | Status: DC
  Filled 2020-10-26: qty 1

## 2020-10-26 MED ORDER — ATORVASTATIN CALCIUM 40 MG PO TABS
40.0000 mg | ORAL_TABLET | Freq: Every day | ORAL | Status: DC
  Administered 2020-10-26: 40 mg via ORAL
  Filled 2020-10-26: qty 1

## 2020-10-26 MED ORDER — CLOPIDOGREL BISULFATE 75 MG PO TABS: 75.0000 mg | ORAL_TABLET | Freq: Every day | ORAL | 2 refills | Status: AC

## 2020-10-26 MED ORDER — ATORVASTATIN CALCIUM 80 MG PO TABS: 80.0000 mg | ORAL_TABLET | Freq: Every day | ORAL | Status: DC

## 2020-10-26 NOTE — Evaluation (Signed)
SLPScreen Patient Details Name: Hayden Golden MRN: [id number] DOB: 1956-03-21   Cancelled treatment:       Reason Eval/Treat Not Completed: Other (comment) (per OT notes, SLP evaluation not needed, please see OT evaluation notes)   Chales Abrahams 10/26/2020, 1:38 PM

## 2020-10-26 NOTE — Progress Notes (Addendum)
STROKE TEAM PROGRESS NOTE    Interval History   No acute events overnight, patient alert up in bed watching TV when the examiner enters the room. Asks examiner what caused his symptoms and if he can leave at 1PM today so he can go home and sleep as he did not sleep well overnight.  He has a prior history of small stroke which presented with similar symptoms years ago with no residual deficits.  Educated about TIA manifestation and the need to complete stroke work up.  Pertinent Lab Work and Imaging    10/25/20 CT Head WO IV Contrast Negative CT of the head.  10/25/20 CT Angio Head and Neck W WO IV Contrast 1. No emergent large vessel occlusion. 2. Minimal atherosclerotic changes at the carotid bifurcations bilaterally without significant stenosis. 3. CTA neck otherwise within normal limits. 4. Normal variant CTA Circle of [place] without significant proximal stenosis, aneurysm, or branch vessel occlusion.  10/25/20 MRI Brain WO IV Contrast No acute infarction, hemorrhage, or mass. Minor chronic microvascular ischemic changes.  10/26/20 Echocardiogram Complete  Results pending   Physical Examination   Constitutional: Calm, appropriate for condition  Cardiovascular: Normal RR Respiratory: No increased WOB   Mental status: AAOx4, following commands  Speech: Fluent with repetition and naming intact  Cranial nerves: EOMI, VFF, Face symmetric, V1V2V3 intact to light tough, tongue midline  Motor: Normal bulk and tone. No drift.   Dlt Bic Tri FgS Grp HF  KnF KnE PIF DoF  R 5 5 5 5 5 5 5 5 5 5   L 5 5 5 5 5 5 5 5 5 5   Sensory: Intact to light tough throughout  Coordination: Intact FNF  Gait: Deferred   NIHSS: 0  Assessment and Plan   Mr. Hayden Golden is a 65 y.o. male w/pmh of emphysema/COPD, aortic atherosclerosis, NSTEMI in 2017, right-sided PE in 2017, DVT in 2018, and a reported stroke 4-5 years ago on ASA 325 mg daily who presents with left sided weakness that began in the  morning around 6AM on 6/8. Sx resolved during the day on 6/8 after the stroke code per patient. Given low NIH of 2 he was not a IVTPA candidate and did not qualify for thrombectomy given lack of LVO on imaging.   #Right hemispheric subcortical TIA due to small vessel disease Patient presented with the symptoms described above. Symptoms of left sided weakness and numbness of the left face, arm and leg that resolved are compelling for a TIA. Stroke work up was initiated. MRI Brain was negative for acute stroke. CTA Head and Neck was non revealing for significant stenosis. Echo is currently pending. Stroke labs w/LDL 121, A1C 6.0. Stroke risk factors include prior stroke, prediabetes. He was started on DAPT that is to be continued for 21 days then afterwards Plavix monotherapy given he was taking ASA prior to admission.  -DAPT for 21 days then Plavix monotherapy. DAPT started 10/26/20 ends 11/16/20 - Continue Atorvastatin 80 mg for secondary stroke prevention  -At discharge please place ambulatory referral to neurology for stroke follow up   #Elevated BP no Diagnosis of Hypertension No history of HTN, pressures were noted to be elevated yesterday in the 140-150 range but today are mostly normotensive. Monitor for HTN, primary team to start anti hyper tensives if indicated.  - BP goal normotensive given no acute stroke   #Hyperlipidemia From a stroke prevention stand point, the LDL goal is < 70. LDL 121 thus he was started on  atorvastatin 80 this admission   #Prediabetes  Hemoglobin A1C this admission noted to be 6.0, recommend following up with PCP on an outpatient basis for prediabetic management.   Hospital day # 0  Stark Jock, NP  [facility] Nurse Practitioner Patient seen and discussed with attending physician Dr. Pearlean Brownie  I have personally obtained history,examined this patient, reviewed notes, independently viewed imaging studies, participated in medical decision making and plan  of care.ROS completed by me personally and pertinent positives fully documented  I have made any additions or clarifications directly to the above note. Agree with note above.  He presented with transient right face and upper extremity numbness and weakness likely due to right brain subcortical TIA from small vessel disease.  Recommend aspirin Plavix for 3 weeks followed by Plavix alone and aggressive risk factor modification.  Continue ongoing stroke work-up.  Follow-up as an outpatient stroke clinic in 6 weeks.  Discussed with patient and Dr. Janee Morn and answered questions.  Greater than 50% time during this 35-minute visit was spent in counseling and coordination of care about his TIA and stroke risk discussion about stroke prevention and treatment and answering questions.  Delia Heady, MD Medical Director [facility] Pager: [phone] 10/26/2020 5:26 PM   To contact Stroke Continuity provider, please refer to [email or web address]. After hours, contact General Neurology

## 2020-10-26 NOTE — Progress Notes (Signed)
Initial Nutrition Assessment  DOCUMENTATION CODES:  Obesity unspecified  INTERVENTION:  Add Magic cup TID with meals, each supplement provides 290 kcal and 9 grams of protein.  Add MVI with minerals daily.  NUTRITION DIAGNOSIS:  Increased nutrient needs related to acute illness as evidenced by estimated needs.  GOAL:  Patient will meet greater than or equal to 90% of their needs  MONITOR:  PO intake, Supplement acceptance, Labs, Weight trends, I & O's  REASON FOR ASSESSMENT:  Consult Assessment of nutrition requirement/status  ASSESSMENT:  65 yo male with a PMH of PE in 2017 (not on AC), COPD, depression, and RLS who presents with TIA.  Spoke to pt at bedside with wife. Both are [place] and very pleasant. Pt had many questions regarding the [place] diet and if their diet was healthy. Discussed healthy breakfast options and addition of fruits and vegetables to his lunches. Discussed limiting simple sugars, beer, and desserts (especially ice cream) to a more "once in a while" treat rather than a daily occurrence. Pt agreeable to changes and interested in healthy, sustainable weight loss.  Pt reports that his weight has gradually increased over the last 20 years (since pt stopped smoking). No weight history in Epic. Per Care Everywhere, pt weighed 225 lbs  in 09/16/19. Pt currently weighs 234 lbs.  On exam, pt has no depletions.  Pt not malnourished at this time given above information.  Recommend adding Magic Cup TID for increased caloric needs and MVI with minerals daily.  Medications: reviewed; NaCl via IV @ 50 ml/hr  Labs: reviewed; CBG 104, ionized Ca 1.17  NUTRITION - FOCUSED PHYSICAL EXAM: Flowsheet Row Most Recent Value  Orbital Region No depletion  Upper Arm Region No depletion  Thoracic and Lumbar Region No depletion  Buccal Region No depletion  Temple Region No depletion  Clavicle Bone Region No depletion  Clavicle and Acromion Bone Region No depletion   Scapular Bone Region No depletion  Dorsal Hand No depletion  Patellar Region No depletion  Anterior Thigh Region No depletion  Posterior Calf Region No depletion  Edema (RD Assessment) None  Hair Reviewed  Eyes Reviewed  Mouth Reviewed  Skin Reviewed  Nails Reviewed   Diet Order:   Diet Order             Diet Heart Room service appropriate? Yes; Fluid consistency: Thin  Diet effective ____                  EDUCATION NEEDS:  Education needs have been addressed  Skin:  Skin Assessment: Reviewed RN Assessment  Last BM:  PTA/unknown  Height:  Ht Readings from Last 1 Encounters:  10/25/20 5\' 10"  (1.778 m)   Weight:  Wt Readings from Last 1 Encounters:  10/25/20 106.2 kg   Ideal Body Weight:  75.5 kg  BMI:  Body mass index is 33.59 kg/m.  Estimated Nutritional Needs:  Kcal:  1850-2050 Protein:  125-140 grams Fluid:  >1.85 L  12/25/20, RD, LDN Registered Dietitian I After-Hours/Weekend Pager # in [place]

## 2020-10-26 NOTE — Discharge Summary (Signed)
Physician Discharge Summary  Hayden Golden YFV:[id number] DOB: 07-20-1955 DOA: 10/25/2020  PCP: Pcp, No  Admit date: 10/25/2020 Discharge date: 10/26/2020  Time spent: 50 minutes  Recommendations for Outpatient Follow-up:  Follow-up with PCP in 2 weeks.  On follow-up patient will need a basic metabolic profile done to follow-up on electrolytes and renal function.  Patient will need prediabetes management. Follow-up with Dr. Pearlean Brownie, [facility] neurology in 4 weeks.   Discharge Diagnoses:  Principal Problem:   TIA (transient ischemic attack) Active Problems:   RLS (restless legs syndrome)   PE (pulmonary thromboembolism) (HCC)   Depression   COPD (chronic obstructive pulmonary disease) (HCC)   Hypokalemia   Prediabetes   Discharge Condition: Stable and improved  Diet recommendation: Heart healthy  Filed Weights   10/25/20 0700  Weight: 106.2 kg    History of present illness:  HPI per Dr. Dierdre Forth is a 65 y.o. [place] male with medical history significant of PE in 2017, not on AC; COPD; depression; and RLS presenting with L-sided numbness/weakness.  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  This AM in the shower, he noticed L facial numbness followed by LUE/LLE weakness and numbness.  Symptoms lasted for minutes to an hour and resolved although he currently feels very fatigued.         ED Course:  LKW 0600.  In shower, nausea and L-sided numbness/weakness - face, arm, leg.  Drowsy upon arrival.  CT negative.  Neurology consulted, MRI pending.  Has painful movement of arm/leg.  Not a candidate for tPA.  Hospital Course:  #1 TIA -Patient presented with left facial and body numbness and left body weakness with symptoms similar to his prior CVA. -Patient noted to have been on a full dose aspirin prior to admission. -Stroke work-up was initiated with head CT which was negative for any acute stroke. -MRI brain was negative for acute  stroke. -CT angiogram head and neck was negative for any significant stenosis. -2D echo done with normal EF,NWMA, no source of emboli. -Fasting lipid done with LDL of 121.  Goal LDL for TIA < 70. -Hemoglobin A1c was 6.0 -Patient was not a candidate for tPA due to rapid improvement with symptoms and lack of LVO on imaging -Patient seen in consultation by neurology will follow the patient throughout the hospitalization -Patient improved clinically and was back to baseline by day of discharge. -Patient placed on dual antiplatelet therapy with Plavix 75 mg daily as well as aspirin 81 mg daily and recommended per neurology to continue for 21 days and then Plavix monotherapy. -Patient seen by PT/OT/ST. -Patient was discharged home in stable and improved condition with outpatient follow-up with neurology in 4 weeks.  2.  Hyperlipidemia -Fasting lipid panel done with LDL of 121.  Goal LDL from a stroke prevention standpoint < 70.  Patient started on atorvastatin 80 mg daily which patient will be discharged home on.  Outpatient follow-up with PCP.  3.  Prediabetes Hemoglobin A1c noted to be 6.0. Outpatient follow-up with PCP for prediabetic management.  4.  COPD -Stable -Patient maintained on home regimen of Anoro.  5.  Depression -Remained stable. -Patient maintained on home regimen of Prozac.  6.  Hypokalemia -Repleted during the hospitalization.  Outpatient follow-up.  Procedures: 2D echo 10/26/2020 CT head without contrast 10/25/2020 CT angiogram head and neck 10/25/2020 MRI brain 10/25/2020   Consultations: Neurology: Dr. Selina Cooley 10/25/2020  Discharge Exam: Vitals:   10/26/20 1638 10/26/20 1208  BP: 121/67 118/82  Pulse: (!) 51 (!) 54  Resp: 16 16  Temp: (!) 97.4 F (36.3 C) 97.8 F (36.6 C)  SpO2: 93% 94%    General: NAD Cardiovascular: Regular rate and rhythm no murmurs rubs or gallops.  No JVD.  No lower extremity edema. Respiratory: Clear to auscultation bilaterally.  No  wheezes, no crackles, no rhonchi.  Fair air movement.  Speaking in full sentences.  Discharge Instructions   Discharge Instructions     Ambulatory referral to Neurology   Complete by: As directed    An appointment is requested in approximately: 4 weeks   Diet - low sodium heart healthy   Complete by: As directed    Increase activity slowly   Complete by: As directed       Allergies as of 10/26/2020   Not on File      Medication List     STOP taking these medications    aspirin 325 MG tablet Replaced by: aspirin 81 MG chewable tablet       TAKE these medications    Anoro Ellipta 62.5-25 MCG/INH Aepb Generic drug: umeclidinium-vilanterol Inhale 1 puff into the lungs daily.   aspirin 81 MG chewable tablet Chew 1 tablet (81 mg total) by mouth daily for 21 days. Start taking on: October 27, 2020 Replaces: aspirin 325 MG tablet   atorvastatin 80 MG tablet Commonly known as: LIPITOR Take 1 tablet (80 mg total) by mouth daily. Start taking on: October 27, 2020   clopidogrel 75 MG tablet Commonly known as: PLAVIX Take 1 tablet (75 mg total) by mouth daily. Start taking on: October 27, 2020   cyanocobalamin 1000 MCG tablet Take 1,000 mcg by mouth daily.   FLUoxetine 20 MG capsule Commonly known as: PROZAC Take 20 mg by mouth daily.       Not on File  Follow-up Information     PCP. Schedule an appointment as soon as possible for a visit in 2 week(s).          Micki Riley, MD. Schedule an appointment as soon as possible for a visit in 4 week(s).   Specialties: Neurology, Radiology Contact information: [address] [place] [address] [phone]                  The results of significant diagnostics from this hospitalization (including imaging, microbiology, ancillary and laboratory) are listed below for reference.    Significant Diagnostic Studies: MR BRAIN WO CONTRAST  Result Date: 10/25/2020 CLINICAL DATA:  Weakness and  nausea, code stroke follow-up EXAM: MRI HEAD WITHOUT CONTRAST TECHNIQUE: Multiplanar, multiecho pulse sequences of the brain and surrounding structures were obtained without intravenous contrast. COMPARISON:  None. FINDINGS: Brain: There is no acute infarction or intracranial hemorrhage. There is no intracranial mass, mass effect, or edema. There is no hydrocephalus or extra-axial fluid collection. Ventricles and sulci are normal in size and configuration. Patchy foci of T2 hyperintensity in the supratentorial white matter are nonspecific but may reflect minor chronic microvascular ischemic changes. Vascular: Major vessel flow voids at the skull base are preserved. Skull and upper cervical spine: Normal marrow signal is preserved. Sinuses/Orbits: Lobular bilateral maxillary sinus alveolar recess mucosal thickening. Minor paranasal sinus mucosal thickening elsewhere. Orbits are unremarkable. Other: Sella is unremarkable.  Mastoid air cells are clear. IMPRESSION: No acute infarction, hemorrhage, or mass. Minor chronic microvascular ischemic changes. Electronically Signed   By: Guadlupe Spanish M.D.   On: 10/25/2020 11:43   ECHOCARDIOGRAM  COMPLETE  Result Date: 10/26/2020    ECHOCARDIOGRAM REPORT   Patient Name:   Hayden Golden Date of Exam: 10/26/2020 Medical Rec #:  [id number]       Height:       70.0 in Accession #:    [id number]      Weight:       234.1 lb Date of Birth:  [birth year]      BSA:          2.232 m Patient Age:    64 years        BP:           126/72 mmHg Patient Gender: M               HR:           61 bpm. Exam Location:  Inpatient Procedure: 2D Echo, Cardiac Doppler and Color Doppler Indications:    TIA  History:        Patient has no prior history of Echocardiogram examinations.                 COPD and Stroke; Risk Factors:Former Smoker.  Sonographer:    Shirlean Kelly Referring Phys: [id number] JENNIFER YATES IMPRESSIONS  1. Left ventricular ejection fraction, by estimation, is 55 to 60%. The left  ventricle has normal function. The left ventricle has no regional wall motion abnormalities. Left ventricular diastolic parameters are consistent with Grade II diastolic dysfunction (pseudonormalization).  2. Right ventricular systolic function is normal. The right ventricular size is normal. Tricuspid regurgitation signal is inadequate for assessing PA pressure.  3. The mitral valve is normal in structure. No evidence of mitral valve regurgitation. No evidence of mitral stenosis.  4. The aortic valve is tricuspid. Aortic valve regurgitation is not visualized. No aortic stenosis is present.  5. The IVC was not visualized. FINDINGS  Left Ventricle: Left ventricular ejection fraction, by estimation, is 55 to 60%. The left ventricle has normal function. The left ventricle has no regional wall motion abnormalities. The left ventricular internal cavity size was normal in size. There is  no left ventricular hypertrophy. Left ventricular diastolic parameters are consistent with Grade II diastolic dysfunction (pseudonormalization). Right Ventricle: The right ventricular size is normal. No increase in right ventricular wall thickness. Right ventricular systolic function is normal. Tricuspid regurgitation signal is inadequate for assessing PA pressure. Left Atrium: Left atrial size was normal in size. Right Atrium: Right atrial size was normal in size. Pericardium: There is no evidence of pericardial effusion. Mitral Valve: The mitral valve is normal in structure. No evidence of mitral valve regurgitation. No evidence of mitral valve stenosis. Tricuspid Valve: The tricuspid valve is normal in structure. Tricuspid valve regurgitation is trivial. Aortic Valve: The aortic valve is tricuspid. Aortic valve regurgitation is not visualized. No aortic stenosis is present. Aortic valve mean gradient measures 4.0 mmHg. Aortic valve peak gradient measures 7.3 mmHg. Aortic valve area, by VTI measures 2.60 cm. Pulmonic Valve: The  pulmonic valve was normal in structure. Pulmonic valve regurgitation is not visualized. Aorta: The aortic root is normal in size and structure. Venous: The inferior vena cava was not well visualized. IAS/Shunts: No atrial level shunt detected by color flow Doppler.  LEFT VENTRICLE PLAX 2D LVIDd:         4.30 cm  Diastology LVIDs:         3.10 cm  LV e' medial:    6.85 cm/s LV PW:  1.10 cm  LV E/e' medial:  11.6 LV IVS:        0.90 cm  LV e' lateral:   11.10 cm/s LVOT diam:     2.00 cm  LV E/e' lateral: 7.2 LV SV:         79 LV SV Index:   35 LVOT Area:     3.14 cm  RIGHT VENTRICLE RV Basal diam:  4.30 cm RV S prime:     10.40 cm/s TAPSE (M-mode): 2.5 cm LEFT ATRIUM             Index       RIGHT ATRIUM           Index LA diam:        3.80 cm 1.70 cm/m  RA Area:     15.60 cm LA Vol (A2C):   66.5 ml 29.80 ml/m RA Volume:   38.90 ml  17.43 ml/m LA Vol (A4C):   60.4 ml 27.06 ml/m LA Biplane Vol: 68.4 ml 30.65 ml/m  AORTIC VALVE AV Area (Vmax):    2.79 cm AV Area (Vmean):   2.65 cm AV Area (VTI):     2.60 cm AV Vmax:           135.00 cm/s AV Vmean:          89.200 cm/s AV VTI:            0.303 m AV Peak Grad:      7.3 mmHg AV Mean Grad:      4.0 mmHg LVOT Vmax:         120.00 cm/s LVOT Vmean:        75.300 cm/s LVOT VTI:          0.251 m LVOT/AV VTI ratio: 0.83  AORTA Ao Root diam: 3.40 cm Ao Asc diam:  3.00 cm MITRAL VALVE MV Area (PHT): 4.89 cm    SHUNTS MV Decel Time: 155 msec    Systemic VTI:  0.25 m MV E velocity: 79.60 cm/s  Systemic Diam: 2.00 cm MV A velocity: 68.60 cm/s MV E/A ratio:  1.16 Marca Ancona MD Electronically signed by Marca Ancona MD Signature Date/Time: 10/26/2020/3:21:16 PM    Final    CT HEAD CODE STROKE WO CONTRAST  Result Date: 10/25/2020 CLINICAL DATA:  Code stroke. Sudden onset dizziness and nausea. Generalized weakness. EXAM: CT HEAD WITHOUT CONTRAST TECHNIQUE: Contiguous axial images were obtained from the base of the skull through the vertex without intravenous contrast.  COMPARISON:  CT head without contrast 11/07/2015. FINDINGS: Brain: No acute infarct, hemorrhage, or mass lesion is present. No significant white matter lesions are present. The ventricles are of normal size. No significant extraaxial fluid collection is present. The brainstem and cerebellum are within normal limits. Vascular: No hyperdense vessel or unexpected calcification. Skull: Calvarium is intact. No focal lytic or blastic lesions are present. No significant extracranial soft tissue lesion is present. Sinuses/Orbits: The paranasal sinuses and mastoid air cells are clear. The globes and orbits are within normal limits. ASPECTS [facility] Stroke Program Early CT Score) - Ganglionic level infarction (caudate, lentiform nuclei, internal capsule, insula, M1-M3 cortex): 7/7 - Supraganglionic infarction (M4-M6 cortex): 3/3 Total score (0-10 with 10 being normal): 10/10 IMPRESSION: Negative CT of the head. Aspects 10/10 The above was relayed via text pager to Dr. Selina Cooley on 10/25/2020 at 08:04 . Electronically Signed   By: Marin Roberts M.D.   On: 10/25/2020 08:06   CT ANGIO HEAD CODE STROKE  Result  Date: 10/25/2020 CLINICAL DATA:  Acute onset of weakness and nausea. Dizziness. Code stroke. EXAM: CT ANGIOGRAPHY HEAD AND NECK TECHNIQUE: Multidetector CT imaging of the head and neck was performed using the standard protocol during bolus administration of intravenous contrast. Multiplanar CT image reconstructions and MIPs were obtained to evaluate the vascular anatomy. Carotid stenosis measurements (when applicable) are obtained utilizing NASCET criteria, using the distal internal carotid diameter as the denominator. CONTRAST:  75mL OMNIPAQUE IOHEXOL 350 MG/ML SOLN COMPARISON:  None. CT head without contrast 10/25/2020 and 11/07/2015 FINDINGS: CTA NECK FINDINGS Aortic arch: A 3 vessel arch configuration is present. No significant atherosclerotic calcifications are present. No aneurysm or stenosis is present. Great  vessel origins are unremarkable. Right carotid system: The right common carotid artery is within normal limits. A noncalcified plaque is present along the posterior margin of the proximal right ICA without significant stenosis. Cervical right ICA is otherwise normal. Left carotid system: The left common carotid artery is within normal limits. Minimal irregularity is present bifurcation without a significant stenosis. Cervical left ICA is otherwise normal. Vertebral arteries: The vertebral arteries are codominant. Both vertebral arteries originate from the subclavian arteries without significant stenosis. No significant stenosis is present in either vertebral artery in the neck. Skeleton: Endplate degenerative changes are present at C5-6 with uncovertebral spurring and foraminal narrowing bilaterally. Vertebral body heights and alignment are otherwise normal. Multiple dental caries noted. No focal lytic or blastic lesions are present. Other neck: Soft tissues the neck are otherwise unremarkable. Upper chest: Centrilobular emphysematous changes are present. Some dependent atelectasis noted. Review of the MIP images confirms the above findings CTA HEAD FINDINGS Anterior circulation: Internal carotid arteries are within normal limits from the skull base through the ICA termini. The A1 and M1 segments are within normal limits. The anterior communicating artery is patent. MCA bifurcations are unremarkable. The ACA and MCA branch vessels are within normal limits. Posterior circulation: The vertebral arteries are codominant. Right PICA origin visualized. AICA vessels are dominant. Vertebrobasilar junction is within normal limits. The basilar artery is normal. Both posterior cerebral arteries originate from basilar tip. The PCA branch vessels are within normal limits bilaterally. Venous sinuses: Dural sinuses are patent. The straight sinus deep cerebral veins are intact. Cortical veins are unremarkable. No vascular  malformation is present. Anatomic variants: None Review of the MIP images confirms the above findings IMPRESSION: 1. No emergent large vessel occlusion. 2. Minimal atherosclerotic changes at the carotid bifurcations bilaterally without significant stenosis. 3. CTA neck otherwise within normal limits. 4. Normal variant CTA Circle of [place] without significant proximal stenosis, aneurysm, or branch vessel occlusion. 5. Degenerative changes of the cervical spine are most evident at C5-6. 6. Emphysema (ICD10-J43.9). Electronically Signed   By: Marin Robertshristopher  Mattern M.D.   On: 10/25/2020 08:27   CT ANGIO NECK CODE STROKE  Result Date: 10/25/2020 CLINICAL DATA:  Acute onset of weakness and nausea. Dizziness. Code stroke. EXAM: CT ANGIOGRAPHY HEAD AND NECK TECHNIQUE: Multidetector CT imaging of the head and neck was performed using the standard protocol during bolus administration of intravenous contrast. Multiplanar CT image reconstructions and MIPs were obtained to evaluate the vascular anatomy. Carotid stenosis measurements (when applicable) are obtained utilizing NASCET criteria, using the distal internal carotid diameter as the denominator. CONTRAST:  75mL OMNIPAQUE IOHEXOL 350 MG/ML SOLN COMPARISON:  None. CT head without contrast 10/25/2020 and 11/07/2015 FINDINGS: CTA NECK FINDINGS Aortic arch: A 3 vessel arch configuration is present. No significant atherosclerotic calcifications are present. No aneurysm or stenosis  is present. Great vessel origins are unremarkable. Right carotid system: The right common carotid artery is within normal limits. A noncalcified plaque is present along the posterior margin of the proximal right ICA without significant stenosis. Cervical right ICA is otherwise normal. Left carotid system: The left common carotid artery is within normal limits. Minimal irregularity is present bifurcation without a significant stenosis. Cervical left ICA is otherwise normal. Vertebral arteries: The  vertebral arteries are codominant. Both vertebral arteries originate from the subclavian arteries without significant stenosis. No significant stenosis is present in either vertebral artery in the neck. Skeleton: Endplate degenerative changes are present at C5-6 with uncovertebral spurring and foraminal narrowing bilaterally. Vertebral body heights and alignment are otherwise normal. Multiple dental caries noted. No focal lytic or blastic lesions are present. Other neck: Soft tissues the neck are otherwise unremarkable. Upper chest: Centrilobular emphysematous changes are present. Some dependent atelectasis noted. Review of the MIP images confirms the above findings CTA HEAD FINDINGS Anterior circulation: Internal carotid arteries are within normal limits from the skull base through the ICA termini. The A1 and M1 segments are within normal limits. The anterior communicating artery is patent. MCA bifurcations are unremarkable. The ACA and MCA branch vessels are within normal limits. Posterior circulation: The vertebral arteries are codominant. Right PICA origin visualized. AICA vessels are dominant. Vertebrobasilar junction is within normal limits. The basilar artery is normal. Both posterior cerebral arteries originate from basilar tip. The PCA branch vessels are within normal limits bilaterally. Venous sinuses: Dural sinuses are patent. The straight sinus deep cerebral veins are intact. Cortical veins are unremarkable. No vascular malformation is present. Anatomic variants: None Review of the MIP images confirms the above findings IMPRESSION: 1. No emergent large vessel occlusion. 2. Minimal atherosclerotic changes at the carotid bifurcations bilaterally without significant stenosis. 3. CTA neck otherwise within normal limits. 4. Normal variant CTA Circle of [place] without significant proximal stenosis, aneurysm, or branch vessel occlusion. 5. Degenerative changes of the cervical spine are most evident at C5-6. 6.  Emphysema (ICD10-J43.9). Electronically Signed   By: Marin Roberts M.D.   On: 10/25/2020 08:27    Microbiology: Recent Results (from the past 240 hour(s))  Resp Panel by RT-PCR (Flu A&B, Covid) Nasopharyngeal Swab     Status: None   Collection Time: 10/25/20  8:00 AM   Specimen: Nasopharyngeal Swab; Nasopharyngeal(NP) swabs in vial transport medium  Result Value Ref Range Status   SARS Coronavirus 2 by RT PCR NEGATIVE NEGATIVE Final    Comment: (NOTE) SARS-CoV-2 target nucleic acids are NOT DETECTED.  The SARS-CoV-2 RNA is generally detectable in upper respiratory specimens during the acute phase of infection. The lowest concentration of SARS-CoV-2 viral copies this assay can detect is 138 copies/mL. A negative result does not preclude SARS-Cov-2 infection and should not be used as the sole basis for treatment or other patient management decisions. A negative result may occur with  improper specimen collection/handling, submission of specimen other than nasopharyngeal swab, presence of viral mutation(s) within the areas targeted by this assay, and inadequate number of viral copies(<138 copies/mL). A negative result must be combined with clinical observations, patient history, and epidemiological information. The expected result is Negative.  Fact Sheet for Patients:  BloggerCourse.com  Fact Sheet for Healthcare Providers:  SeriousBroker.it  This test is no t yet approved or cleared by the [place] FDA and  has been authorized for detection and/or diagnosis of SARS-CoV-2 by FDA under an Emergency Use Authorization (EUA). This EUA will remain  in effect (meaning this test can be used) for the duration of the COVID-19 declaration under Section 564(b)(1) of the Act, 21 U.S.C.section 360bbb-3(b)(1), unless the authorization is terminated  or revoked sooner.       Influenza A by PCR NEGATIVE NEGATIVE Final   Influenza B  by PCR NEGATIVE NEGATIVE Final    Comment: (NOTE) The Xpert Xpress SARS-CoV-2/FLU/RSV plus assay is intended as an aid in the diagnosis of influenza from Nasopharyngeal swab specimens and should not be used as a sole basis for treatment. Nasal washings and aspirates are unacceptable for Xpert Xpress SARS-CoV-2/FLU/RSV testing.  Fact Sheet for Patients: BloggerCourse.com  Fact Sheet for Healthcare Providers: SeriousBroker.it  This test is not yet approved or cleared by the [place] FDA and has been authorized for detection and/or diagnosis of SARS-CoV-2 by FDA under an Emergency Use Authorization (EUA). This EUA will remain in effect (meaning this test can be used) for the duration of the COVID-19 declaration under Section 564(b)(1) of the Act, 21 U.S.C. section 360bbb-3(b)(1), unless the authorization is terminated or revoked.  Performed at [facility], [address] [place], [place] [address]      Labs: Basic Metabolic Panel: Recent Labs  Lab 10/25/20 0755 10/25/20 0802 10/25/20 0822 10/26/20 0920  NA 136 139 137 139  K 4.3 4.6 3.9 3.2*  CL 107 108  --  104  CO2 19*  --   --  25  GLUCOSE 120* 120*  --  102*  BUN 15 20  --  13  CREATININE 1.22 1.10  --  1.21  CALCIUM 9.0  --   --  8.9  MG  --   --   --  1.9   Liver Function Tests: Recent Labs  Lab 10/25/20 0755  AST 27  ALT 25  ALKPHOS 110  BILITOT 1.3*  PROT 6.5  ALBUMIN 3.8   No results for input(s): LIPASE, AMYLASE in the last 168 hours. No results for input(s): AMMONIA in the last 168 hours. CBC: Recent Labs  Lab 10/25/20 0802 10/25/20 0822 10/25/20 0823 10/26/20 0920  WBC  --   --  7.5 8.4  NEUTROABS  --   --  3.5 3.9  HGB 16.0 14.3 15.2 15.8  HCT 47.0 42.0 44.3 46.8  MCV  --   --  90.4 90.2  PLT  --   --  229 272   Cardiac Enzymes: No results for input(s): CKTOTAL, CKMB, CKMBINDEX, TROPONINI in the last 168 hours. BNP: BNP  (last 3 results) No results for input(s): BNP in the last 8760 hours.  ProBNP (last 3 results) No results for input(s): PROBNP in the last 8760 hours.  CBG: Recent Labs  Lab 10/25/20 0754  GLUCAP 104*       Signed:  Ramiro Harvest MD.  [facility] 10/26/2020, 4:02 PM

## 2020-10-26 NOTE — Progress Notes (Signed)
  Echocardiogram 2D Echocardiogram has been performed.  Hayden Golden  Hayden Golden 10/26/2020, 10:14 AM

## 2020-10-26 NOTE — Progress Notes (Signed)
Occupational Therapy Evaluation Patient Details Name: Hayden Golden MRN: [id number] DOB: 1955/11/21 Today's Date: 10/26/2020    History of Present Illness 65 y.o. [place] male  presenting with L-sided numbness/weakness. PMH: PE in 2017, not on AC; COPD; depression; and RLS.   Clinical Impression   PTA pt independent with ADL and mobility and works as a Psychologist, forensic. Appears at baseline. Educated on warning signs/symptoms of CVA using [employer]. Written information provided on TIA. OT signing off.     Follow Up Recommendations  No OT follow up    Equipment Recommendations  None recommended by OT    Recommendations for Other Services       Precautions / Restrictions Precautions Precautions: None      Mobility Bed Mobility Overal bed mobility: Independent                  Transfers Overall transfer level: Independent                    Balance Overall balance assessment: Independent                               Standardized Balance Assessment Standardized Balance Assessment : Dynamic Gait Index   Dynamic Gait Index Level Surface: Normal Change in Gait Speed: Normal Gait with Horizontal Head Turns: Normal Gait with Vertical Head Turns: Normal Gait and Pivot Turn: Normal Step Over Obstacle: Normal Step Around Obstacles: Normal Steps: Normal Total Score: 24     ADL either performed or assessed with clinical judgement   ADL Overall ADL's : At baseline                                             Vision Baseline Vision/History: Wears glasses Wears Glasses: Reading only (occasionally) Vision Assessment?: Yes;No apparent visual deficits     Perception Perception Comments: WFL   Praxis Praxis Praxis tested?: Within functional limits    Pertinent Vitals/Pain Pain Assessment: No/denies pain     Hand Dominance Right   Extremity/Trunk Assessment Upper Extremity Assessment Upper Extremity  Assessment: Overall WFL for tasks assessed   Lower Extremity Assessment Lower Extremity Assessment: Overall WFL for tasks assessed   Cervical / Trunk Assessment Cervical / Trunk Assessment: Normal   Communication Communication Communication: No difficulties   Cognition Arousal/Alertness: Awake/alert Behavior During Therapy: WFL for tasks assessed/performed Overall Cognitive Status: Within Functional Limits for tasks assessed                                     General Comments       Exercises     Shoulder Instructions      Home Living Family/patient expects to be discharged to:: Private residence Living Arrangements: Spouse/significant other Available Help at Discharge: Family;Available PRN/intermittently Type of Home: House Home Access: Stairs to enter [employer] of Steps: 3 Entrance Stairs-Rails: Right;Left Home Layout: One level     Bathroom Shower/Tub: IT trainer: Standard Bathroom Accessibility: Yes How Accessible: Accessible via walker Home Equipment: None          Prior Functioning/Environment Level of Independence: Independent        Comments: works in Engineer, petroleum  OT Problem List:        OT Treatment/Interventions:      OT Goals(Current goals can be found in the care plan section) Acute Rehab OT Goals Patient Stated Goal: go home OT Goal Formulation: All assessment and education complete, DC therapy  OT Frequency:     Barriers to D/C:            Co-evaluation              AM-PAC OT "6 Clicks" Daily Activity     Outcome Measure Help from another person eating meals?: None Help from another person taking care of personal grooming?: None Help from another person toileting, which includes using toliet, bedpan, or urinal?: None Help from another person bathing (including washing, rinsing, drying)?: None Help from another person to put on and  taking off regular upper body clothing?: None Help from another person to put on and taking off regular lower body clothing?: None 6 Click Score: 24   End of Session Nurse Communication: Other (comment) (no OT/PT needs)  Activity Tolerance: Patient tolerated treatment well Patient left: in bed;with call bell/phone within reach  OT Visit Diagnosis: Other abnormalities of gait and mobility (R26.89)                Time: 0932-6712 OT Time Calculation (min): 23 min Charges:  OT General Charges $OT Visit: 1 Visit OT Evaluation $OT Eval Low Complexity: 1 Low  Luisa Dago, OT/L   Acute OT Clinical Specialist Acute Rehabilitation Services Pager [phone] Office [phone]   [facility] 10/26/2020, 9:45 AM

## 2020-10-26 NOTE — Progress Notes (Signed)
PT Cancellation Note  Patient Details Name: Hayden Golden MRN: [id number] DOB: [birth year]   Cancelled Treatment:    Reason Eval/Treat Not Completed: PT screened, no needs identified, will sign off Spoke with OT, and no skilled PT needs at this time. If needs change, please re-consult.   Cindee Salt, DPT  Acute Rehabilitation Services  Pager: [phone] Office: [phone]    Hayden Golden 10/26/2020, 8:34 AM

## 2022-06-27 ENCOUNTER — Emergency Department (HOSPITAL_COMMUNITY): Payer: Commercial Managed Care - HMO

## 2022-06-27 ENCOUNTER — Encounter (HOSPITAL_COMMUNITY): Payer: Self-pay

## 2022-06-27 ENCOUNTER — Other Ambulatory Visit: Payer: Self-pay

## 2022-06-27 ENCOUNTER — Emergency Department (HOSPITAL_COMMUNITY)
Admission: EM | Admit: 2022-06-27 | Discharge: 2022-06-27 | Disposition: A | Discharge status: Home / Self Care | Payer: Commercial Managed Care - HMO | Attending: Emergency Medicine | Admitting: Emergency Medicine

## 2022-06-27 DIAGNOSIS — R079 Chest pain, unspecified: Secondary | ICD-10-CM | POA: Diagnosis not present

## 2022-06-27 DIAGNOSIS — Z7902 Long term (current) use of antithrombotics/antiplatelets: Secondary | ICD-10-CM | POA: Insufficient documentation

## 2022-06-27 DIAGNOSIS — J449 Chronic obstructive pulmonary disease, unspecified: Secondary | ICD-10-CM | POA: Insufficient documentation

## 2022-06-27 LAB — CBC WITH DIFFERENTIAL/PLATELET
Abs Immature Granulocytes: 0.07 10*3/uL (ref 0.00–0.07)
Basophils Absolute: 0.1 10*3/uL (ref 0.0–0.1)
Basophils Relative: 1 %
Eosinophils Absolute: 0.3 10*3/uL (ref 0.0–0.5)
Eosinophils Relative: 3 %
HCT: 44.2 % (ref 39.0–52.0)
Hemoglobin: 15.1 g/dL (ref 13.0–17.0)
Immature Granulocytes: 1 %
Lymphocytes Relative: 21 %
Lymphs Abs: 2 10*3/uL (ref 0.7–4.0)
MCH: 30.6 pg (ref 26.0–34.0)
MCHC: 34.2 g/dL (ref 30.0–36.0)
MCV: 89.5 fL (ref 80.0–100.0)
Monocytes Absolute: 1 10*3/uL (ref 0.1–1.0)
Monocytes Relative: 11 %
Neutro Abs: 6.1 10*3/uL (ref 1.7–7.7)
Neutrophils Relative %: 63 %
Platelets: 282 10*3/uL (ref 150–400)
RBC: 4.94 MIL/uL (ref 4.22–5.81)
RDW: 12.3 % (ref 11.5–15.5)
WBC: 9.6 10*3/uL (ref 4.0–10.5)
nRBC: 0 % (ref 0.0–0.2)

## 2022-06-27 LAB — COMPREHENSIVE METABOLIC PANEL
ALT: 26 U/L (ref 0–44)
AST: 26 U/L (ref 15–41)
Albumin: 4 g/dL (ref 3.5–5.0)
Alkaline Phosphatase: 125 U/L (ref 38–126)
Anion gap: 10 (ref 5–15)
BUN: 12 mg/dL (ref 8–23)
CO2: 23 mmol/L (ref 22–32)
Calcium: 8.7 mg/dL — ABNORMAL LOW (ref 8.9–10.3)
Chloride: 106 mmol/L (ref 98–111)
Creatinine, Ser: 1.27 mg/dL — ABNORMAL HIGH (ref 0.61–1.24)
GFR, Estimated: >60 mL/min (ref >60)
Glucose, Bld: 88 mg/dL (ref 70–99)
Potassium: 3.6 mmol/L (ref 3.5–5.1)
Sodium: 139 mmol/L (ref 135–145)
Total Bilirubin: 0.5 mg/dL (ref 0.3–1.2)
Total Protein: 6.8 g/dL (ref 6.5–8.1)

## 2022-06-27 LAB — D-DIMER, QUANTITATIVE: D-Dimer, Quant: 0.31 ug/mL-FEU (ref 0.00–0.50)

## 2022-06-27 LAB — TROPONIN I (HIGH SENSITIVITY): Troponin I (High Sensitivity): 4 ng/L (ref <18)

## 2022-06-27 MED ORDER — METHOCARBAMOL 500 MG PO TABS: 500.0000 mg | ORAL_TABLET | Freq: Three times a day (TID) | ORAL | 0 refills | Status: AC | PRN

## 2022-06-27 NOTE — ED Provider Notes (Signed)
[facility] Provider Note   CSN: [id number] Arrival date & time: 06/27/22  1339     History  Chief Complaint  Patient presents with   Chest Pain    Hayden Golden is a 67 y.o. male.   Chest Pain Patient presents with anterior chest pain.  Lower part of chest.  Goes to the right side.  Is been about 10 minutes.  Not exertional.  Not associate with eating.  And actually a little worse with breathing.  Does not come on exertion.  Feeling somewhat better now but will spasm up at times.  Does have history of PE but that was years ago.  States it was after a long plane trip.  No swelling in his legs or recent travel.    Past Medical History:  Diagnosis Date   COPD (chronic obstructive pulmonary disease) ([place])    Depression    PE (pulmonary thromboembolism) ([place]) 2017   RLS (restless legs syndrome)     Home Medications Prior to Admission medications   Medication Sig Start Date End Date Taking? Authorizing Provider  methocarbamol (ROBAXIN) 500 MG tablet Take 1 tablet (500 mg total) by mouth every 8 (eight) hours as needed for muscle spasms. 06/27/22  Yes Davonna Belling, MD  ANORO ELLIPTA 62.5-25 MCG/INH AEPB Inhale 1 puff into the lungs daily. 10/19/20   [provider]  atorvastatin (LIPITOR) 80 MG tablet Take 1 tablet (80 mg total) by mouth daily. 10/27/20   Eugenie Filler, MD  clopidogrel (PLAVIX) 75 MG tablet Take 1 tablet (75 mg total) by mouth daily. 10/27/20   Eugenie Filler, MD  cyanocobalamin 1000 MCG tablet Take 1,000 mcg by mouth daily.    [provider]  FLUoxetine (PROZAC) 20 MG capsule Take 20 mg by mouth daily. 09/20/20   [provider]      Allergies    Patient has no known allergies.    Review of Systems   Review of Systems  Cardiovascular:  Positive for chest pain.    Physical Exam Updated Vital Signs BP (!) 155/88   Pulse 77   Temp 97.7 F (36.5 C) (Oral)   Resp 15   Ht 5'  10" (1.778 m)   Wt 115.7 kg   SpO2 100%   BMI 36.59 kg/m  Physical Exam Vitals and nursing note reviewed.  Cardiovascular:     Rate and Rhythm: Normal rate and regular rhythm.  Pulmonary:     Breath sounds: No wheezing or rhonchi.  Chest:     Chest wall: No tenderness.  Abdominal:     Tenderness: There is no abdominal tenderness.  Musculoskeletal:     Cervical back: Neck supple.     Right lower leg: No edema.     Left lower leg: No edema.  Neurological:     Mental Status: He is alert.     ED Results / Procedures / Treatments   Labs (all labs ordered are listed, but only abnormal results are displayed) Labs Reviewed  COMPREHENSIVE METABOLIC PANEL - Abnormal; Notable for the following components:      Result Value   Creatinine, Ser 1.27 (*)    Calcium 8.7 (*)    All other components within normal limits  CBC WITH DIFFERENTIAL/PLATELET  D-DIMER, QUANTITATIVE  TROPONIN I (HIGH SENSITIVITY)    EKG EKG Interpretation  Date/Time:  Thursday June 27 2022 13:44:21 EST Ventricular Rate:  78 PR Interval:  171 QRS Duration: 97 QT  Interval:  379 QTC Calculation: 432 R Axis:   44 Text Interpretation: Sinus rhythm Low voltage, precordial leads Abnormal R-wave progression, early transition Confirmed by Davonna Belling [phone]) on 06/27/2022 1:47:47 PM  Radiology DG Chest Portable 1 View  Result Date: 06/27/2022 CLINICAL DATA:  Right-side chest pain, shortness of breath for 2 weeks, BILATERAL lower extremity swelling EXAM: PORTABLE CHEST 1 VIEW COMPARISON:  Portable exam 1420 hours compared to 05/31/2022 FINDINGS: Upper normal heart size. Mediastinal contours and pulmonary vascularity normal. Bibasilar atelectasis. Lungs otherwise clear. No infiltrate, pleural effusion, or pneumothorax. Osseous structures unremarkable. IMPRESSION: Bibasilar atelectasis. Electronically Signed   By: Lavonia Dana M.D.   On: 06/27/2022 14:33    Procedures Procedures    Medications Ordered in  ED Medications - No data to display  ED Course/ Medical Decision Making/ A&P                             Medical Decision Making Amount and/or Complexity of Data Reviewed Labs: ordered. Radiology: ordered.  Risk Prescription drug management.   Patient with chest pain.  Lower chest.  Differential diagnosis includes musculoskeletal pain, cardiac pain, pneumonia, intra-abdominal issues.  EKG reassuring.  Reviewed previous records and has had a stress test 8 months ago that was negative.  Previous PEs but negative D-dimer.  I think he is low risk and can be ruled out with a D-dimer.  LFTs normal.  Not associate eating.  Gallbladder pathology felt less likely.  Will treat with muscle laxer's and outpatient follow-up.  Appears stable for discharge home.        Final Clinical Impression(s) / ED Diagnoses Final diagnoses:  Nonspecific chest pain    Rx / DC Orders ED Discharge Orders          Ordered    methocarbamol (ROBAXIN) 500 MG tablet  Every 8 hours PRN        06/27/22 1514              Davonna Belling, MD 06/27/22 1520

## 2022-06-27 NOTE — ED Triage Notes (Signed)
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

## 2023-04-26 IMAGING — MR MR HEAD W/O CM
9 of 10 series · 39 of 48 positions shown · non-contrast
Comparison: None.

CLINICAL DATA: Weakness and nausea, code stroke follow-up

EXAM:
MRI HEAD WITHOUT CONTRAST
TECHNIQUE: Multiplanar, multiecho pulse sequences of the brain and surrounding
structures were obtained without intravenous contrast.

[Series 3: DWI · axial · 3.0mm · 1.09mm/px · z∈[-77,+82]mm · 11 of 110 slices shown (1 of 4)]
[im 1/110]
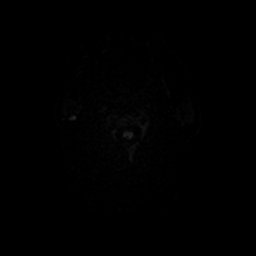
[im 11/110]
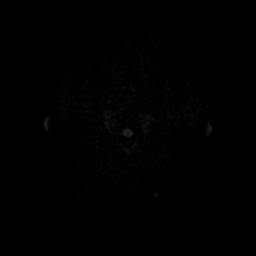
[im 22/110]
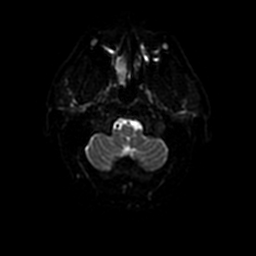
[im 33/110]
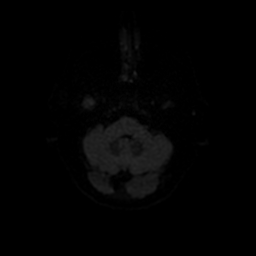
[im 44/110]
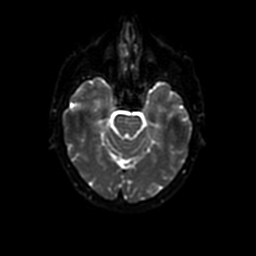
[im 55/110]
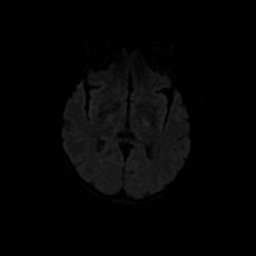
[im 66/110]
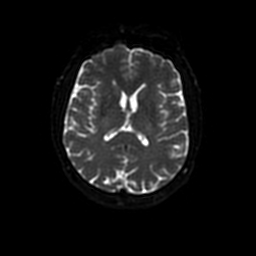
[im 77/110]
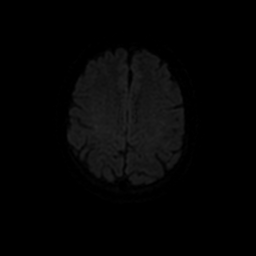
[im 88/110]
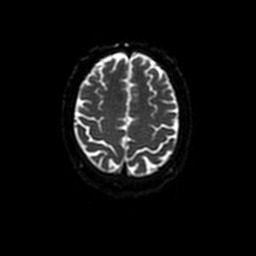
[im 99/110]
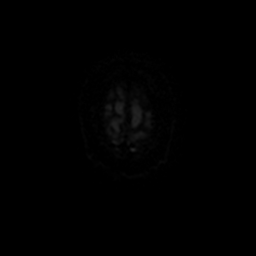
[im 110/110]
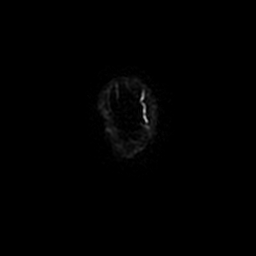

[Series 4: DWI · coronal · 5.0mm · 1.09mm/px · 8 of 75 slices shown (2 of 4)]
[im 1/75]
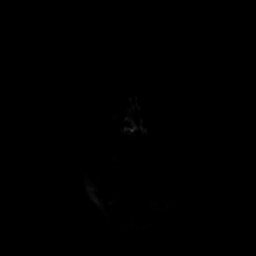
[im 11/75]
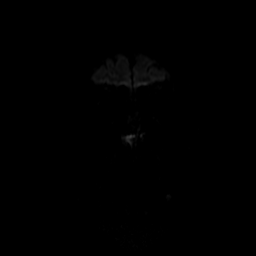
[im 22/75]
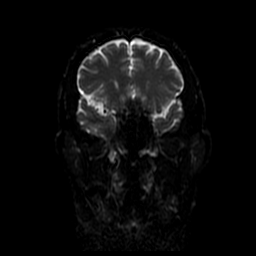
[im 32/75]
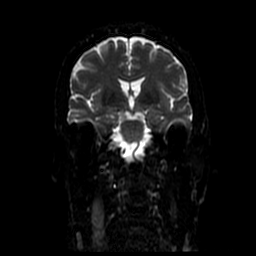
[im 43/75]
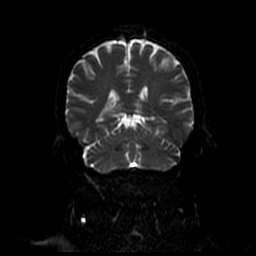
[im 53/75]
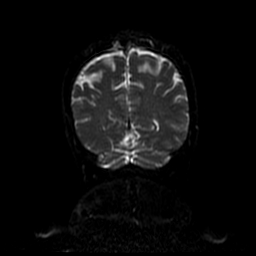
[im 64/75]
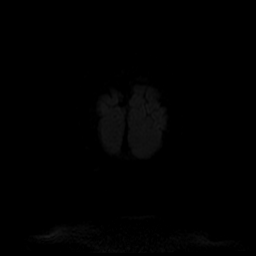
[im 75/75]
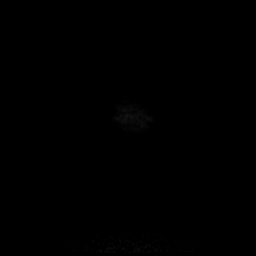

[Series 5: T1 · sagittal · 5.0mm · 0.47mm/px · 2 of 23 slices shown (1 of 2)]
[im 1/23]
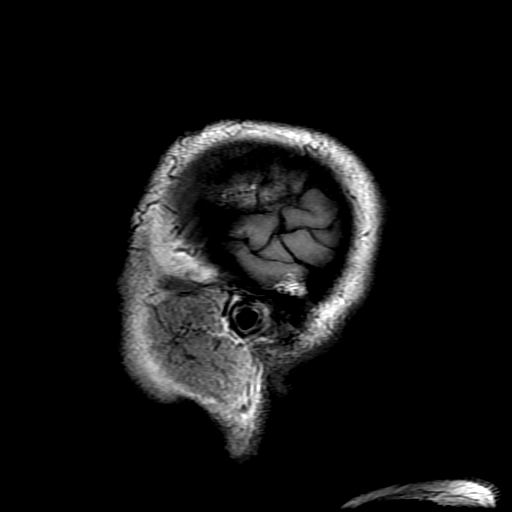
[im 23/23]
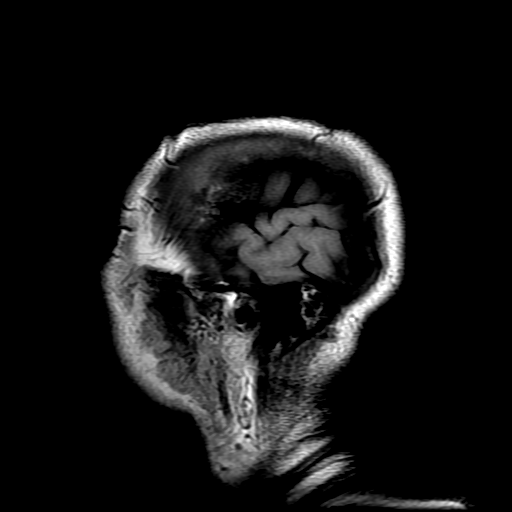

[Series 6: T2 · axial · 5.0mm · 0.43mm/px · z∈[-68,+77]mm · 2 of 26 slices shown (1 of 2)]
[im 1/26]
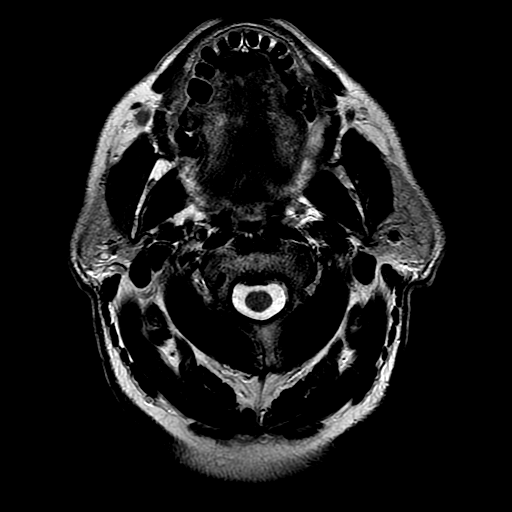
[im 26/26]
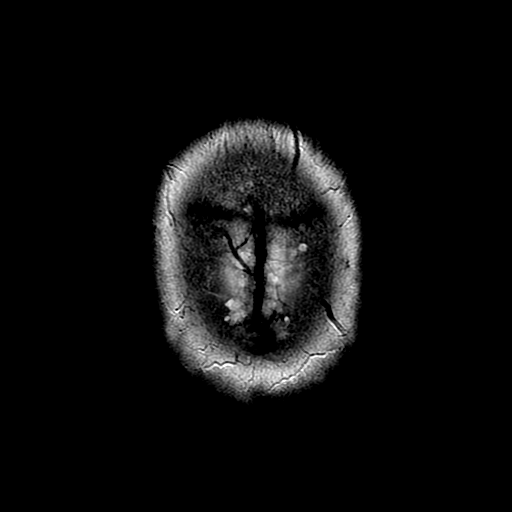

[Series 7: FLAIR · axial · 3.0mm · 0.43mm/px · z∈[-68,+77]mm · 2 of 26 slices shown]
[im 1/26]
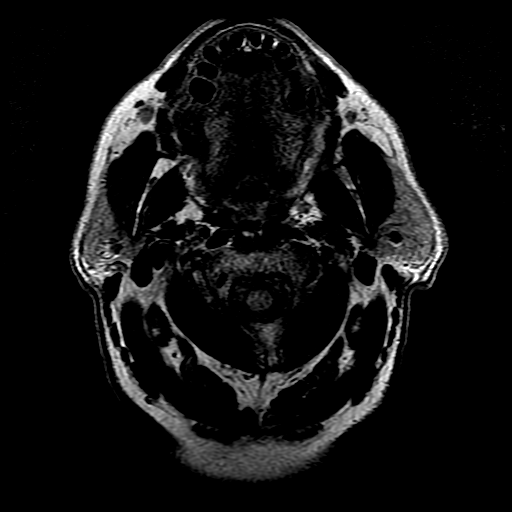
[im 26/26]
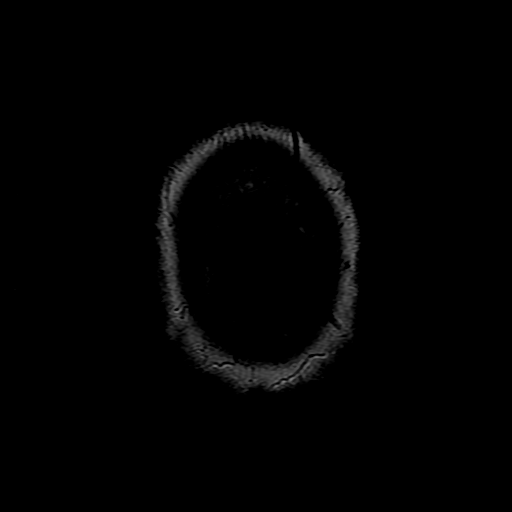

[Series 9: T1 · axial · 3.0mm · 0.43mm/px · z∈[-69,-20]mm · 3 of 104 slices shown (2 of 2)]
[im 1/104]
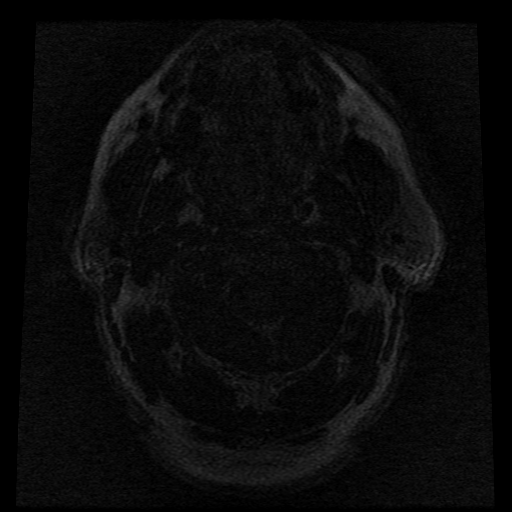
[im 12/104]
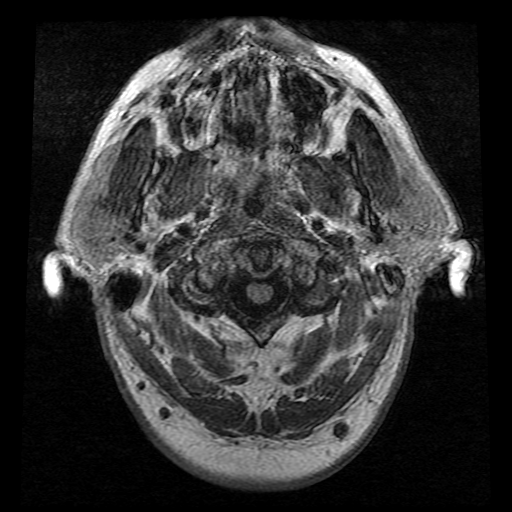
[im 35/104]
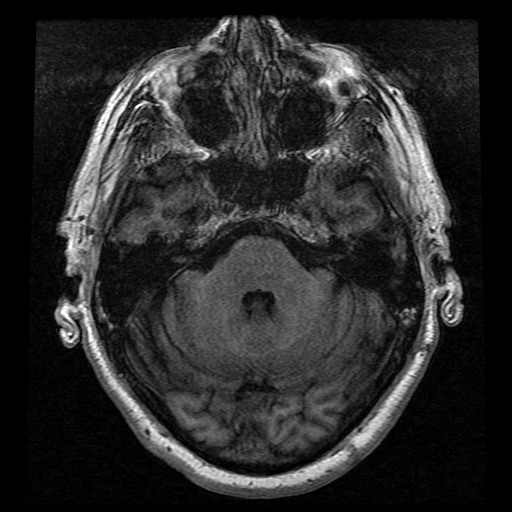

[Series 10: T2 · coronal · 5.0mm · 0.39mm/px · 2 of 24 slices shown (2 of 2)]
[im 1/24]
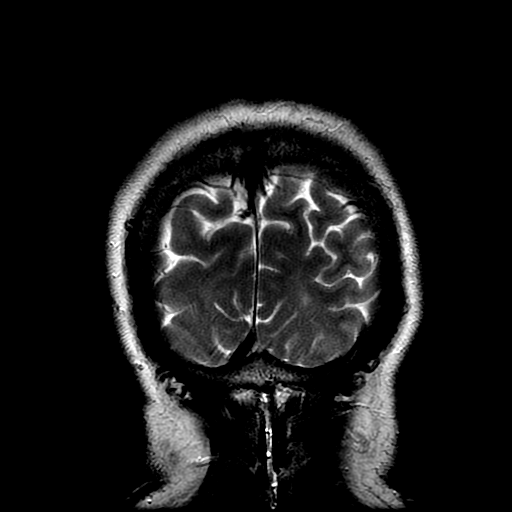
[im 24/24]
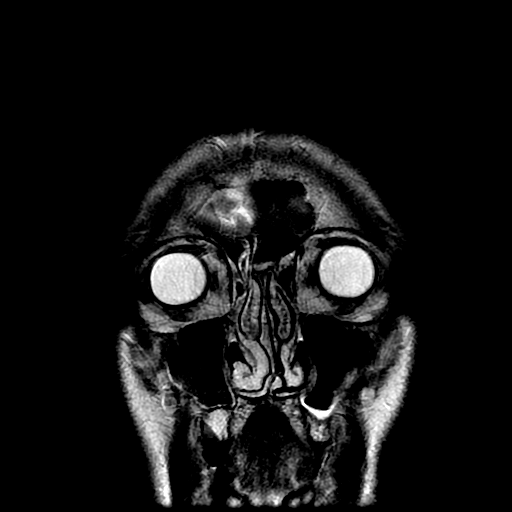

[Series 300: DWI · axial · 3.0mm · 1.09mm/px · z∈[-77,+82]mm · 5 of 55 slices shown (3 of 4)]
[im 1/55]
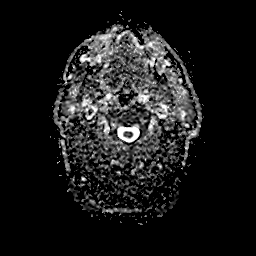
[im 14/55]
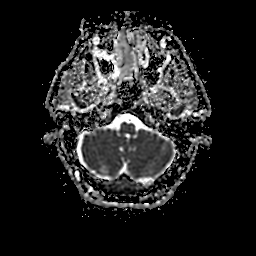
[im 28/55]
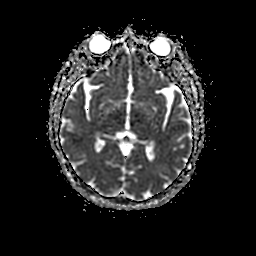
[im 41/55]
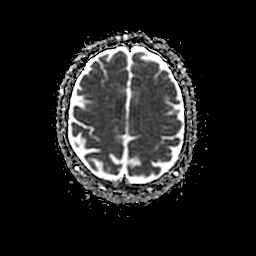
[im 55/55]
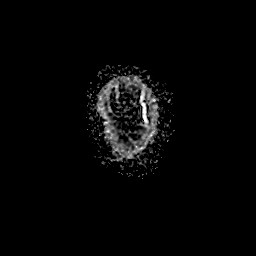

[Series 400: DWI · coronal · 5.0mm · 1.09mm/px · 4 of 38 slices shown (4 of 4)]
[im 1/38]
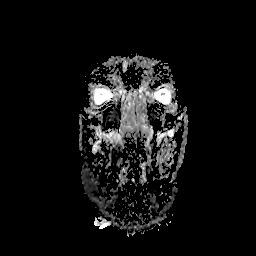
[im 13/38]
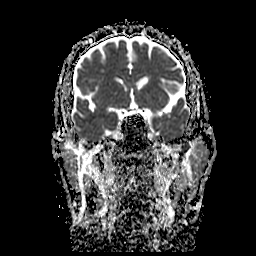
[im 25/38]
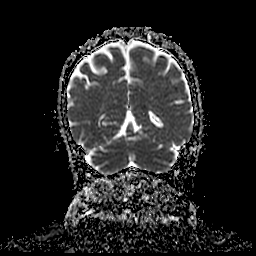
[im 38/38]
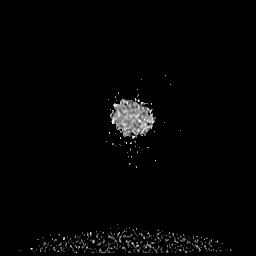

[39 of 48 positions shown; findings below may reference images not displayed]

FINDINGS: Brain: There is no acute infarction or intracranial hemorrhage.
There is no intracranial mass, mass effect, or edema. There is no
hydrocephalus or extra-axial fluid collection. Ventricles and sulci
are normal in size and configuration. Patchy foci of T2
hyperintensity in the supratentorial white matter are nonspecific
but may reflect minor chronic microvascular ischemic changes.

Vascular: Major vessel flow voids at the skull base are preserved.

Skull and upper cervical spine: Normal marrow signal is preserved.

Sinuses/Orbits: Lobular bilateral maxillary sinus alveolar recess
mucosal thickening. Minor paranasal sinus mucosal thickening
elsewhere. Orbits are unremarkable.

Other: Sella is unremarkable.  Mastoid air cells are clear.
IMPRESSION: No acute infarction, hemorrhage, or mass. Minor chronic
microvascular ischemic changes.
